# Patient Record
Sex: Male | Born: 1980 | Race: Black or African American | Hispanic: No | Marital: Married | Smoking: Current every day smoker
Health system: Southern US, Community
[De-identification: ages and names within clinical notes are randomized; demographics above are authoritative.]

## PROBLEM LIST (undated history)

## (undated) DIAGNOSIS — Z789 Other specified health status: Secondary | ICD-10-CM

## (undated) HISTORY — PX: PILONIDAL CYST EXCISION: SHX744

## (undated) HISTORY — PX: OTHER SURGICAL HISTORY: SHX169

## (undated) HISTORY — DX: Other specified health status: Z78.9

---

## 2000-07-01 ENCOUNTER — Emergency Department (HOSPITAL_COMMUNITY): Admission: EM | Admit: 2000-07-01 | Discharge: 2000-07-01 | Payer: Self-pay | Admitting: Emergency Medicine

## 2001-11-16 ENCOUNTER — Emergency Department (HOSPITAL_COMMUNITY): Admission: EM | Admit: 2001-11-16 | Discharge: 2001-11-16 | Payer: Self-pay | Admitting: Emergency Medicine

## 2004-08-17 ENCOUNTER — Emergency Department (HOSPITAL_COMMUNITY): Admission: EM | Admit: 2004-08-17 | Discharge: 2004-08-17 | Payer: Self-pay | Admitting: Emergency Medicine

## 2006-09-28 ENCOUNTER — Emergency Department (HOSPITAL_COMMUNITY): Admission: EM | Admit: 2006-09-28 | Discharge: 2006-09-28 | Payer: Self-pay | Admitting: Family Medicine

## 2008-02-15 ENCOUNTER — Emergency Department (HOSPITAL_COMMUNITY): Admission: EM | Admit: 2008-02-15 | Discharge: 2008-02-15 | Payer: Self-pay | Admitting: Emergency Medicine

## 2008-04-23 ENCOUNTER — Emergency Department (HOSPITAL_COMMUNITY): Admission: EM | Admit: 2008-04-23 | Discharge: 2008-04-23 | Payer: Self-pay | Admitting: Family Medicine

## 2008-12-31 ENCOUNTER — Emergency Department (HOSPITAL_COMMUNITY): Admission: EM | Admit: 2008-12-31 | Discharge: 2008-12-31 | Payer: Self-pay | Admitting: Emergency Medicine

## 2010-09-28 IMAGING — CR DG FOOT COMPLETE 3+V*R*
3 series · 3 of 3 positions shown · non-contrast
Comparison: None

CLINICAL DATA: Pain after jumping

RIGHT FOOT COMPLETE - 3+ VIEW

[view not recorded (1 of 3)]
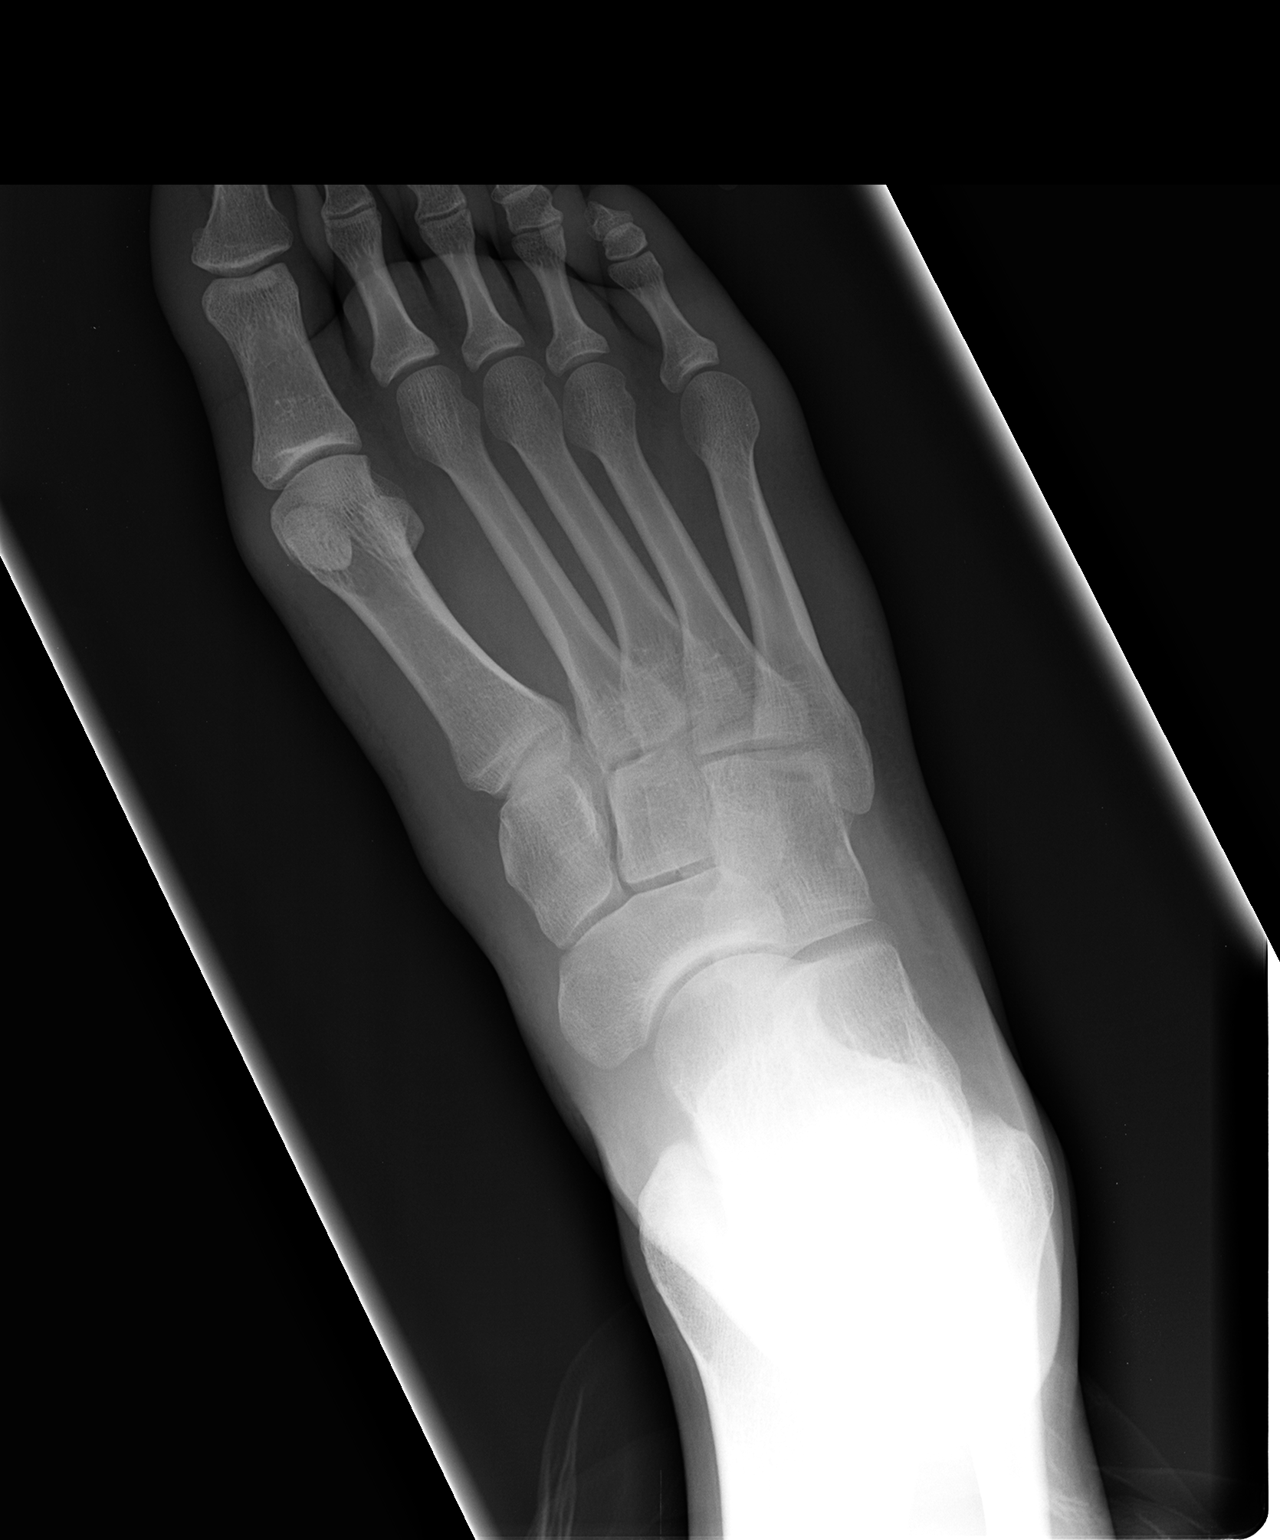

[view not recorded (2 of 3)]
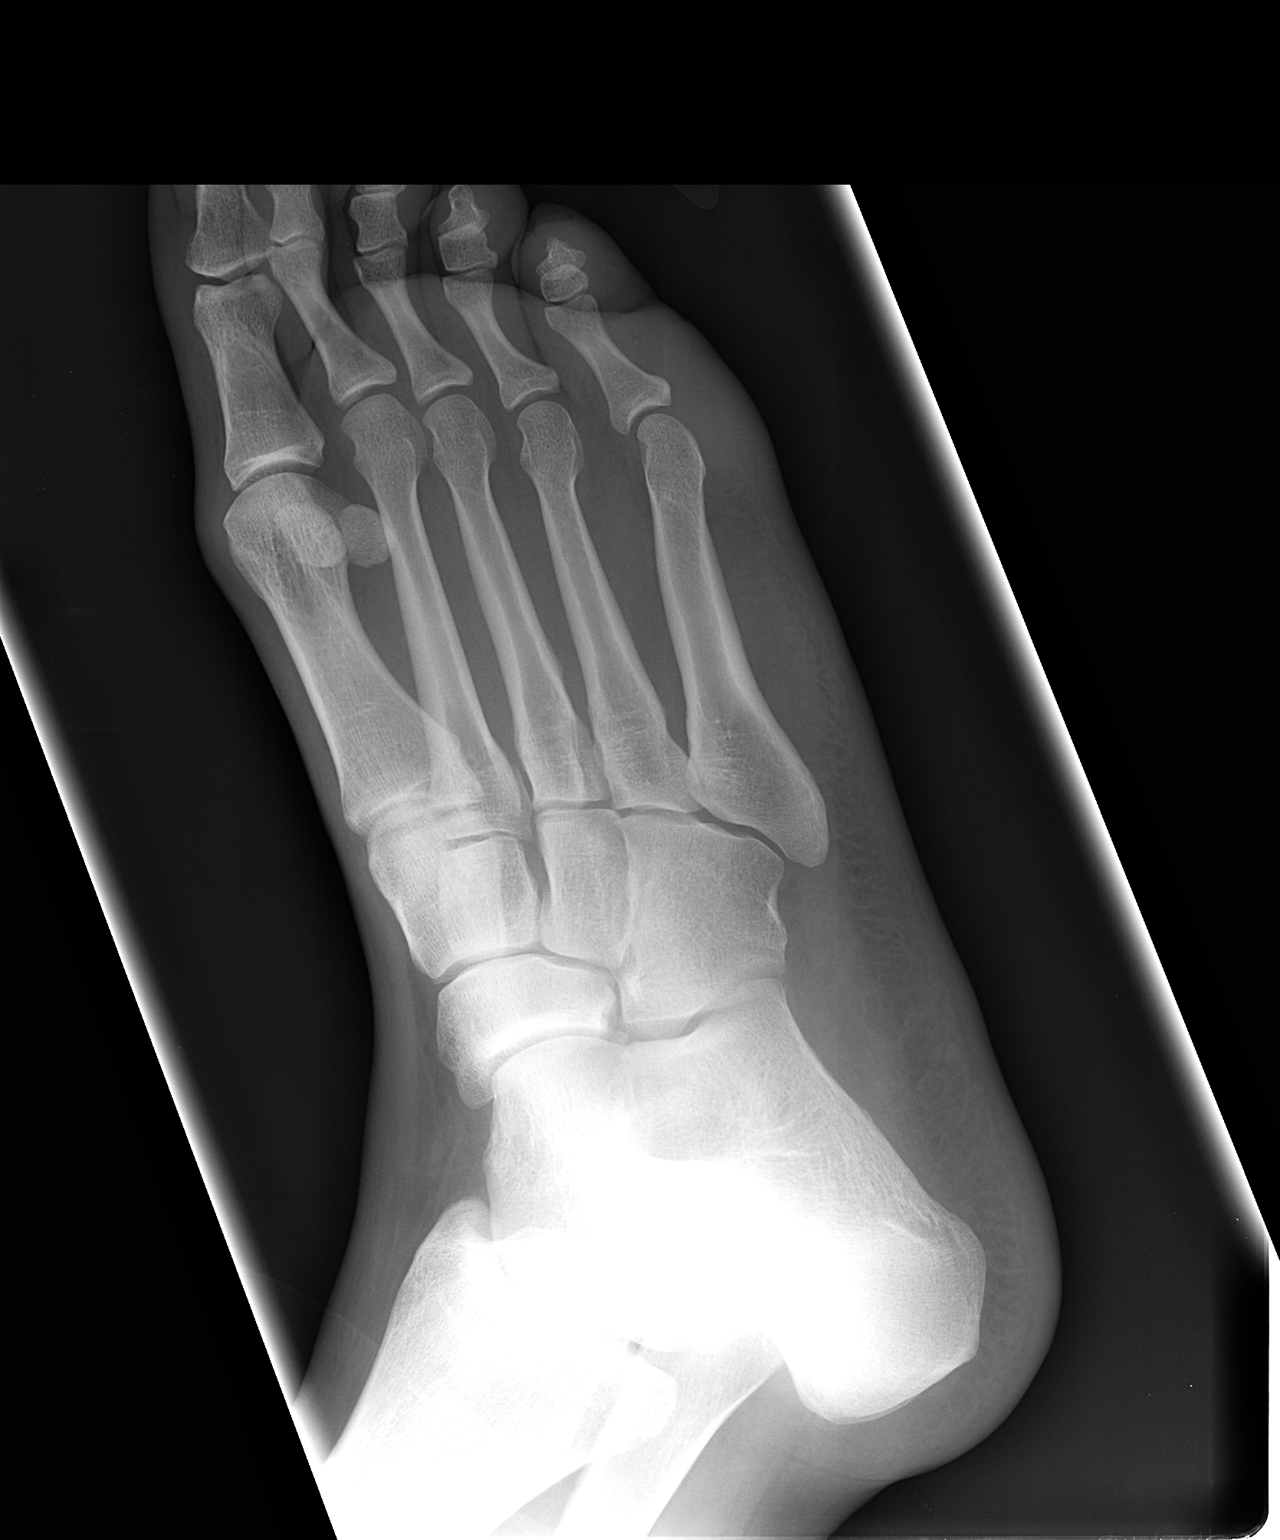

[view not recorded (3 of 3)]
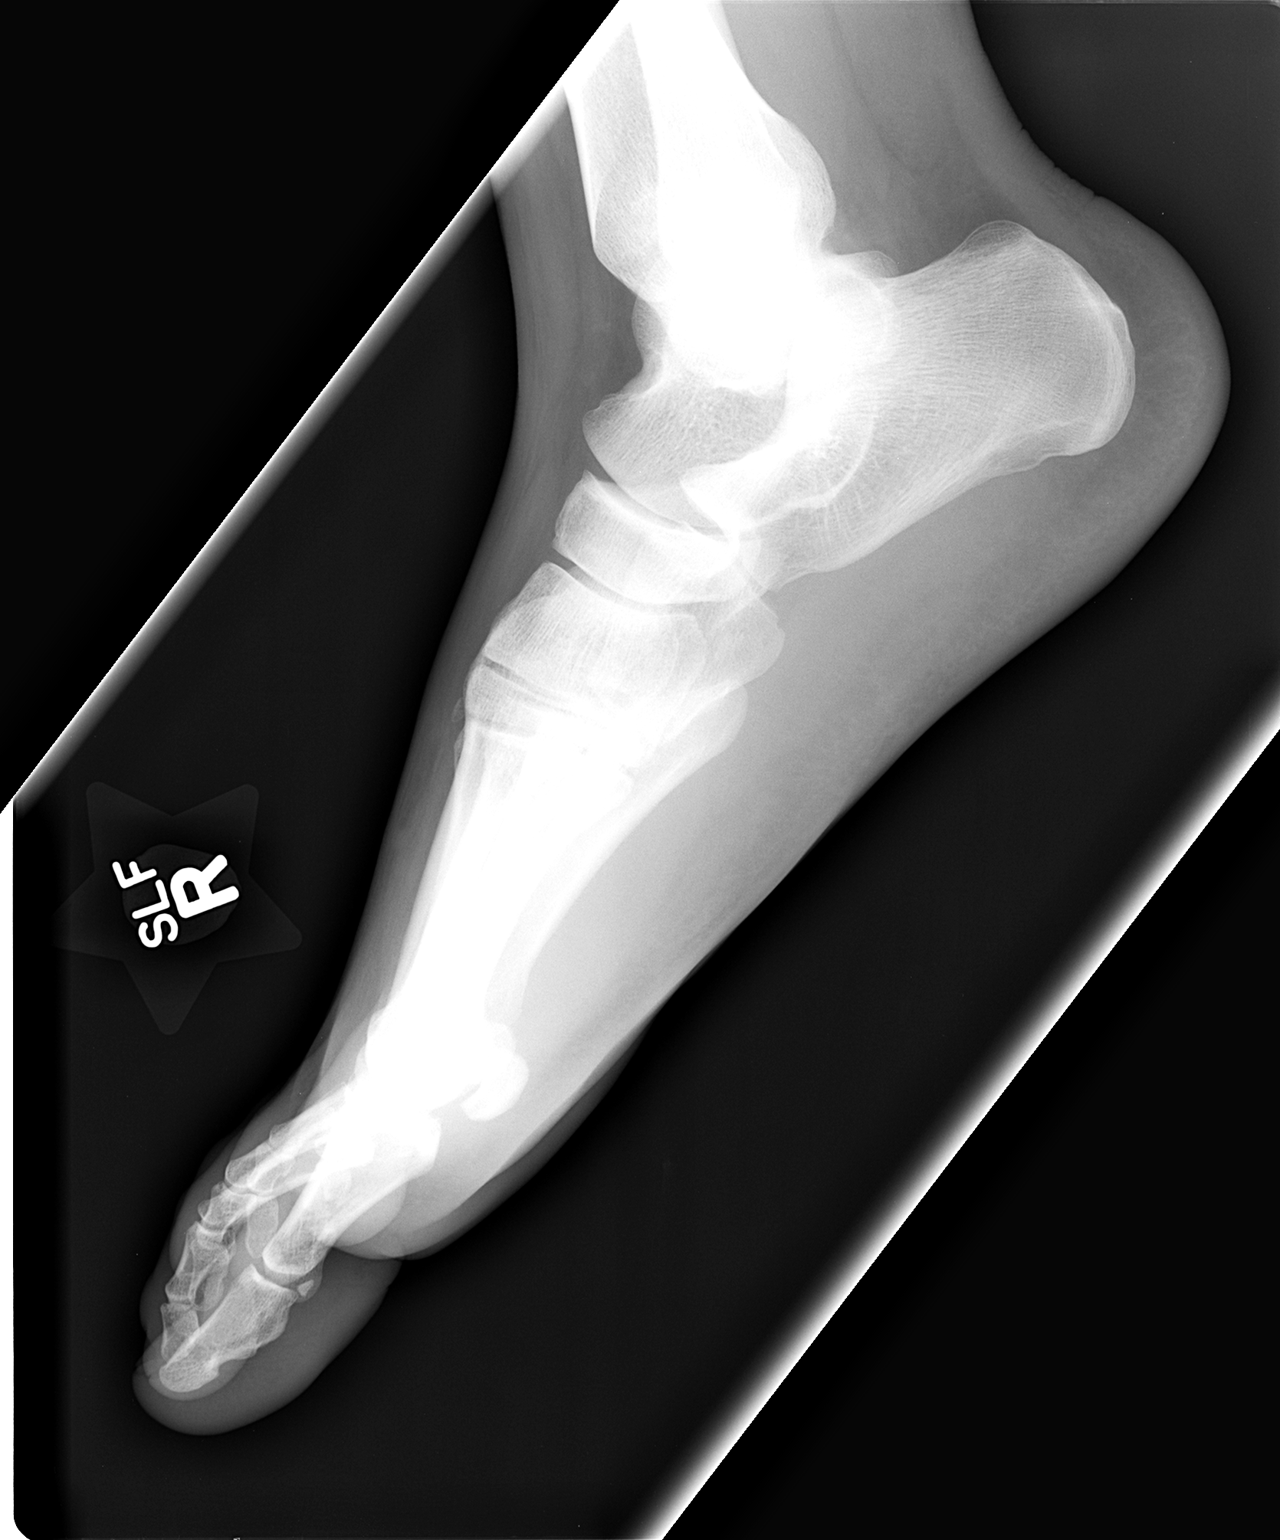

[3 of 3 positions shown; findings below may reference images not displayed]

FINDINGS: Negative for fracture, dislocation, or other acute
abnormality.  Normal alignment and mineralization. No significant
degenerative change.  Regional soft tissues unremarkable.
IMPRESSION: Negative

## 2011-04-10 ENCOUNTER — Inpatient Hospital Stay (INDEPENDENT_AMBULATORY_CARE_PROVIDER_SITE_OTHER)
Admission: RE | Admit: 2011-04-10 | Discharge: 2011-04-10 | Disposition: A | Discharge status: Home / Self Care | Payer: BC Managed Care – PPO | Source: Ambulatory Visit | Attending: Family Medicine | Admitting: Family Medicine

## 2011-04-10 DIAGNOSIS — M79609 Pain in unspecified limb: Secondary | ICD-10-CM

## 2011-04-10 LAB — GLUCOSE, CAPILLARY: Glucose-Capillary: 102 mg/dL — ABNORMAL HIGH (ref 70–99)

## 2011-07-21 LAB — CULTURE, ROUTINE-ABSCESS

## 2012-11-18 ENCOUNTER — Encounter (HOSPITAL_COMMUNITY): Payer: Self-pay | Admitting: Emergency Medicine

## 2012-11-18 ENCOUNTER — Emergency Department (HOSPITAL_COMMUNITY)
Admission: EM | Admit: 2012-11-18 | Discharge: 2012-11-18 | Disposition: A | Discharge status: Home / Self Care | Payer: BC Managed Care – PPO | Source: Home / Self Care | Attending: Emergency Medicine | Admitting: Emergency Medicine

## 2012-11-18 DIAGNOSIS — B353 Tinea pedis: Secondary | ICD-10-CM

## 2012-11-18 MED ORDER — DOMEBORO 25 % EX PACK: PACK | CUTANEOUS | Status: DC

## 2012-11-18 MED ORDER — NAFTIFINE HCL 2 % EX CREA: 1.0000 "application " | TOPICAL_CREAM | Freq: Two times a day (BID) | CUTANEOUS | Status: DC

## 2012-11-18 NOTE — ED Notes (Signed)
Pt c/o right foot irritation between toes. Area between toes are red and cracked. X 6 months.   Pt has used foot soaks. Bought new shoes and socks and rotates shoes that he wears to work.   Pt is having some pain.

## 2012-11-18 NOTE — ED Provider Notes (Signed)
Chief Complaint  Patient presents with  . Rash    rash between toes on right foot only and scaley dry patches on bottom of foot.     History of Present Illness:   Omar Shannon is a 32 year old male who presents with a six-month history of athlete's foot in the interdigital webs between the fourth, fifth, and third toes. This is very itchy. He's tried over-the-counter antifungals without relief as well as peroxide and alcohol. He denies any rash elsewhere on the body.  Review of Systems:  Other than noted above, the patient denies any of the following symptoms: Systemic:  No fever, chills, sweats, weight loss, or fatigue. ENT:  No nasal congestion, rhinorrhea, sore throat, swelling of lips, tongue or throat. Resp:  No cough, wheezing, or shortness of breath. Skin:  No rash, itching, nodules, or suspicious lesions.  PMFSH:  Past medical history, family history, social history, meds, and allergies were reviewed.  Physical Exam:   Vital signs:  BP 122/77  Pulse 67  Temp 97.7 F (36.5 C) (Oral)  Resp 18  SpO2 99% Gen:  Alert, oriented, in no distress. ENT:  Pharynx clear, no intraoral lesions, moist mucous membranes. Lungs:  Clear to auscultation. Skin:  There was cracking and scaling in the interdigital webs between the fourth and fifth and the third and fourth toes of the right foot. There is no rash on the plantar surface of the foot. The left foot was normal. Skin exam was otherwise unremarkable.  Assessment:  The encounter diagnosis was Tinea pedis.  Plan:   1.  The following meds were prescribed:   New Prescriptions   ALUM SULFATE-CA ACETATE (DOMEBORO) 25 % PACK    Dissolve 8 packets in 1 gallon of water.   NAFTIFINE HCL (NAFTIN) 2 % CREA    Apply 1 application topically 2 (two) times daily.   2.  The patient was instructed in symptomatic care and handouts were given. 3.  The patient was told to return if becoming worse in any way, if no better in 3 or 4 days, and given some  red flag symptoms that would indicate earlier return.     Reuben Likes, MD 11/18/12 [phone]

## 2012-11-26 MED ORDER — TERBINAFINE HCL 1 % EX CREA: TOPICAL_CREAM | Freq: Two times a day (BID) | CUTANEOUS | Status: DC

## 2018-08-27 ENCOUNTER — Encounter: Payer: Self-pay | Admitting: Family Medicine

## 2018-08-27 ENCOUNTER — Ambulatory Visit (INDEPENDENT_AMBULATORY_CARE_PROVIDER_SITE_OTHER): Payer: BLUE CROSS/BLUE SHIELD | Admitting: Family Medicine

## 2018-08-27 VITALS — BP 120/82 | HR 80 | Temp 98.2°F | Ht 67.0 in | Wt 249.1 lb

## 2018-08-27 DIAGNOSIS — Z6839 Body mass index (BMI) 39.0-39.9, adult: Secondary | ICD-10-CM | POA: Diagnosis not present

## 2018-08-27 DIAGNOSIS — E6609 Other obesity due to excess calories: Secondary | ICD-10-CM | POA: Insufficient documentation

## 2018-08-27 DIAGNOSIS — Z0001 Encounter for general adult medical examination with abnormal findings: Secondary | ICD-10-CM | POA: Diagnosis not present

## 2018-08-27 DIAGNOSIS — Z131 Encounter for screening for diabetes mellitus: Secondary | ICD-10-CM | POA: Insufficient documentation

## 2018-08-27 NOTE — Progress Notes (Addendum)
Subjective:  Patient ID: Omar Shannon, male    DOB: 1981-06-03  Age: 37 y.o. MRN: [id number]  CC: Establish Care   HPI Omar Shannon presents for to establish care and for physical exam.  To his knowledge he enjoys good health.  However he has gained some 30 pounds over the last few years.  He does smoke a half a pack of cigarettes a day.  He smokes marijuana on occasion.  He does not drink alcohol.  He lives with his wife.  He is currently working 2 jobs and Pharmacologist.  He does not have time to exercise.  His mom has hypertension.  Father's health history is mostly unknown.  He has had noticed some visual changes involving OD.  He has never had a formal eye exam.  He is nonfasting today.  He tells me that his well pump was recently changed and the bill was $1800. History Dshawn has no past medical history on file.   He has a past surgical history that includes inguinal cyst.   His family history includes Hypertension in his mother.He reports that he has been smoking. He has been smoking about 0.50 packs per day. He has never used smokeless tobacco. He reports that . Drug: Marijuana. He reports that he does not drink alcohol.  Outpatient Medications Prior to Visit  Medication Sig Dispense Refill  . Alum Sulfate-Ca Acetate (DOMEBORO) 25 % PACK Dissolve 8 packets in 1 gallon of water. 8 each 3  . Naftifine HCl (NAFTIN) 2 % CREA Apply 1 application topically 2 (two) times daily. 45 g 3  . terbinafine (LAMISIL) 1 % cream Apply topically 2 (two) times daily. 30 g 2   No facility-administered medications prior to visit.     ROS Review of Systems  Constitutional: Negative for chills, diaphoresis, fatigue, fever and unexpected weight change.  HENT: Negative.   Eyes: Positive for visual disturbance. Negative for photophobia, pain and redness.  Respiratory: Negative.   Cardiovascular: Negative.   Gastrointestinal: Negative.   Endocrine: Negative for polyphagia and polyuria.    Genitourinary: Negative.   Musculoskeletal: Negative for gait problem and joint swelling.  Skin: Negative for pallor and rash.  Neurological: Negative for weakness and headaches.  Hematological: Does not bruise/bleed easily.  Psychiatric/Behavioral: Negative.     Objective:  BP 120/82 (BP Location: Right Arm, Patient Position: Sitting, Cuff Size: Large)   Pulse 80   Temp 98.2 F (36.8 C) (Oral)   Ht 5\' 7"  (1.702 m)   Wt 249 lb 2 oz (113 kg)   SpO2 98%   BMI 39.02 kg/m   Physical Exam  Constitutional: He is oriented to person, place, and time. He appears well-developed and well-nourished. No distress.  HENT:  Head: Normocephalic and atraumatic.  Right Ear: External ear normal.  Left Ear: External ear normal.  Nose: Nose normal.  Mouth/Throat: Oropharynx is clear and moist. No oropharyngeal exudate.  Eyes: Pupils are equal, round, and reactive to light. Conjunctivae and EOM are normal. Right eye exhibits no discharge. Left eye exhibits no discharge. No scleral icterus.  Neck: Neck supple. No JVD present. No tracheal deviation present. No thyromegaly present.  Cardiovascular: Normal rate, regular rhythm and normal heart sounds.  Pulmonary/Chest: Effort normal and breath sounds normal.  Abdominal: Soft. Bowel sounds are normal. He exhibits distension. He exhibits no mass. There is no tenderness. There is no rebound and no guarding. Hernia confirmed negative in the right inguinal area and confirmed negative in the left  inguinal area.  Genitourinary: Testes normal and penis normal. Right testis shows no mass, no swelling and no tenderness. Right testis is descended. Left testis shows no mass, no swelling and no tenderness. Left testis is descended. Circumcised. No hypospadias, penile erythema or penile tenderness. No discharge found.  Musculoskeletal: He exhibits no edema.  Lymphadenopathy:    He has no cervical adenopathy. No inguinal adenopathy noted on the right or left side.   Neurological: He is alert and oriented to person, place, and time.  Skin: Skin is warm and dry. He is not diaphoretic.  Psychiatric: He has a normal mood and affect. His behavior is normal.      Assessment & Plan:   Mills was seen today for establish care.  Diagnoses and all orders for this visit:  Encounter for health maintenance examination with abnormal findings -     CBC; Future -     Comprehensive metabolic panel; Future -     Lipid panel; Future -     Urinalysis, Routine w reflex microscopic; Future -     Ambulatory referral to Ophthalmology  Class 2 obesity due to excess calories with body mass index (BMI) of 39.0 to 39.9 in adult, unspecified whether serious comorbidity present   I have discontinued Audric D. Montini's Naftifine HCl, DOMEBORO, and terbinafine.  No orders of the defined types were placed in this encounter.  Patient was given information on health maintenance and disease prevention including exercising to lose weight and information on obesity.  Suggested that he start an exercise routine shooting for 30 minutes of physical lacks exercise at least 5 days a week.  He was advised to stop smoking.  We talked about looking into trach programs offered at [employer] in order to find a higher pain job that would afford him more time at home and avoid the stress working 2 jobs.  Suggested follow-up will pend the results of his lab work.  Patient will return fasting for above ordered blood work.  Follow-up: Return in about 6 months (around 02/25/2019), or suggested follow up pends lab results.  Mliss Sax, MD

## 2018-08-27 NOTE — Patient Instructions (Signed)
Exercising to Lose Weight Exercising can help you to lose weight. In order to lose weight through exercise, you need to do vigorous-intensity exercise. You can tell that you are exercising with vigorous intensity if you are breathing very hard and fast and cannot hold a conversation while exercising. Moderate-intensity exercise helps to maintain your current weight. You can tell that you are exercising at a moderate level if you have a higher heart rate and faster breathing, but you are still able to hold a conversation. How often should I exercise? Choose an activity that you enjoy and set realistic goals. Your health care provider can help you to make an activity plan that works for you. Exercise regularly as directed by your health care provider. This may include:  Doing resistance training twice each week, such as: ? Push-ups. ? Sit-ups. ? Lifting weights. ? Using resistance bands.  Doing a given intensity of exercise for a given amount of time. Choose from these options: ? 150 minutes of moderate-intensity exercise every week. ? 75 minutes of vigorous-intensity exercise every week. ? A mix of moderate-intensity and vigorous-intensity exercise every week.  Children, pregnant women, people who are out of shape, people who are overweight, and older adults may need to consult a health care provider for individual recommendations. If you have any sort of medical condition, be sure to consult your health care provider before starting a new exercise program. What are some activities that can help me to lose weight?  Walking at a rate of at least 4.5 miles an hour.  Jogging or running at a rate of 5 miles per hour.  Biking at a rate of at least 10 miles per hour.  Lap swimming.  Roller-skating or in-line skating.  Cross-country skiing.  Vigorous competitive sports, such as football, basketball, and soccer.  Jumping rope.  Aerobic dancing. How can I be more active in my day-to-day  activities?  Use the stairs instead of the elevator.  Take a walk during your lunch break.  If you drive, park your car farther away from work or school.  If you take public transportation, get off one stop early and walk the rest of the way.  Make all of your phone calls while standing up and walking around.  Get up, stretch, and walk around every 30 minutes throughout the day. What guidelines should I follow while exercising?  Do not exercise so much that you hurt yourself, feel dizzy, or get very short of breath.  Consult your health care provider prior to starting a new exercise program.  Wear comfortable clothes and shoes with good support.  Drink plenty of water while you exercise to prevent dehydration or heat stroke. Body water is lost during exercise and must be replaced.  Work out until you breathe faster and your heart beats faster. This information is not intended to replace advice given to you by your health care provider. Make sure you discuss any questions you have with your health care provider. Document Released: 11/12/2010 Document Revised: 03/17/2016 Document Reviewed: 03/13/2014 Elsevier Interactive Patient Education  2018 [employer] Inc.  Health Maintenance, Male A healthy lifestyle and preventive care is important for your health and wellness. Ask your health care provider about what schedule of regular examinations is right for you. What should I know about weight and diet? Eat a Healthy Diet  Eat plenty of vegetables, fruits, whole grains, low-fat dairy products, and lean protein.  Do not eat a lot of foods high in solid fats,   added sugars, or salt.  Maintain a Healthy Weight Regular exercise can help you achieve or maintain a healthy weight. You should:  Do at least 150 minutes of exercise each week. The exercise should increase your heart rate and make you sweat (moderate-intensity exercise).  Do strength-training exercises at least twice a  week.  Watch Your Levels of Cholesterol and Blood Lipids  Have your blood tested for lipids and cholesterol every 5 years starting at 37 years of age. If you are at high risk for heart disease, you should start having your blood tested when you are 37 years old. You may need to have your cholesterol levels checked more often if: ? Your lipid or cholesterol levels are high. ? You are older than 37 years of age. ? You are at high risk for heart disease.  What should I know about cancer screening? Many types of cancers can be detected early and may often be prevented. Lung Cancer  You should be screened every year for lung cancer if: ? You are a current smoker who has smoked for at least 30 years. ? You are a former smoker who has quit within the past 15 years.  Talk to your health care provider about your screening options, when you should start screening, and how often you should be screened.  Colorectal Cancer  Routine colorectal cancer screening usually begins at 37 years of age and should be repeated every 5-10 years until you are 37 years old. You may need to be screened more often if early forms of precancerous polyps or small growths are found. Your health care provider may recommend screening at an earlier age if you have risk factors for colon cancer.  Your health care provider may recommend using home test kits to check for hidden blood in the stool.  A small camera at the end of a tube can be used to examine your colon (sigmoidoscopy or colonoscopy). This checks for the earliest forms of colorectal cancer.  Prostate and Testicular Cancer  Depending on your age and overall health, your health care provider may do certain tests to screen for prostate and testicular cancer.  Talk to your health care provider about any symptoms or concerns you have about testicular or prostate cancer.  Skin Cancer  Check your skin from head to toe regularly.  Tell your health care provider  about any new moles or changes in moles, especially if: ? There is a change in a mole's size, shape, or color. ? You have a mole that is larger than a pencil eraser.  Always use sunscreen. Apply sunscreen liberally and repeat throughout the day.  Protect yourself by wearing long sleeves, pants, a wide-brimmed hat, and sunglasses when outside.  What should I know about heart disease, diabetes, and high blood pressure?  If you are 18-39 years of age, have your blood pressure checked every 3-5 years. If you are 40 years of age or older, have your blood pressure checked every year. You should have your blood pressure measured twice-once when you are at a hospital or clinic, and once when you are not at a hospital or clinic. Record the average of the two measurements. To check your blood pressure when you are not at a hospital or clinic, you can use: ? An automated blood pressure machine at a pharmacy. ? A home blood pressure monitor.  Talk to your health care provider about your target blood pressure.  If you are between 45-79 years old, ask   your health care provider if you should take aspirin to prevent heart disease.  Have regular diabetes screenings by checking your fasting blood sugar level. ? If you are at a normal weight and have a low risk for diabetes, have this test once every three years after the age of 45. ? If you are overweight and have a high risk for diabetes, consider being tested at a younger age or more often.  A one-time screening for abdominal aortic aneurysm (AAA) by ultrasound is recommended for men aged 65-75 years who are current or former smokers. What should I know about preventing infection? Hepatitis B If you have a higher risk for hepatitis B, you should be screened for this virus. Talk with your health care provider to find out if you are at risk for hepatitis B infection. Hepatitis C Blood testing is recommended for:  Everyone born from 1945 through  1965.  Anyone with known risk factors for hepatitis C.  Sexually Transmitted Diseases (STDs)  You should be screened each year for STDs including gonorrhea and chlamydia if: ? You are sexually active and are younger than 37 years of age. ? You are older than 37 years of age and your health care provider tells you that you are at risk for this type of infection. ? Your sexual activity has changed since you were last screened and you are at an increased risk for chlamydia or gonorrhea. Ask your health care provider if you are at risk.  Talk with your health care provider about whether you are at high risk of being infected with HIV. Your health care provider may recommend a prescription medicine to help prevent HIV infection.  What else can I do?  Schedule regular health, dental, and eye exams.  Stay current with your vaccines (immunizations).  Do not use any tobacco products, such as cigarettes, chewing tobacco, and e-cigarettes. If you need help quitting, ask your health care provider.  Limit alcohol intake to no more than 2 drinks per day. One drink equals 12 ounces of beer, 5 ounces of wine, or 1 ounces of hard liquor.  Do not use street drugs.  Do not share needles.  Ask your health care provider for help if you need support or information about quitting drugs.  Tell your health care provider if you often feel depressed.  Tell your health care provider if you have ever been abused or do not feel safe at home. This information is not intended to replace advice given to you by your health care provider. Make sure you discuss any questions you have with your health care provider. Document Released: 04/07/2008 Document Revised: 06/08/2016 Document Reviewed: 07/14/2015 Elsevier Interactive Patient Education  2018 Else[employer] Inc.  Obesity, Adult Obesity is the condition of having too much total body fat. Being overweight or obese means that your weight is greater than what is  considered healthy for your body size. Obesity is determined by a measurement called BMI. BMI is an estimate of body fat and is calculated from height and weight. For adults, a BMI of 30 or higher is considered obese. Obesity can eventually lead to other health concerns and major illnesses, including:  Stroke.  Coronary artery disease (CAD).  Type 2 diabetes.  Some types of cancer, including cancers of the colon, breast, uterus, and gallbladder.  Osteoarthritis.  High blood pressure (hypertension).  High cholesterol.  Sleep apnea.  Gallbladder stones.  Infertility problems.  What are the causes? The main cause of obesity is   taking in (consuming) more calories than your body uses for energy. Other factors that contribute to this condition may include:  Being born with genes that make you more likely to become obese.  Having a medical condition that causes obesity. These conditions include: ? Hypothyroidism. ? Polycystic ovarian syndrome (PCOS). ? Binge-eating disorder. ? Cushing syndrome.  Taking certain medicines, such as steroids, antidepressants, and seizure medicines.  Not being physically active (sedentary lifestyle).  Living where there are limited places to exercise safely or buy healthy foods.  Not getting enough sleep.  What increases the risk? The following factors may increase your risk of this condition:  Having a family history of obesity.  Being a woman of African-American descent.  Being a man of Hispanic descent.  What are the signs or symptoms? Having excessive body fat is the main symptom of this condition. How is this diagnosed? This condition may be diagnosed based on:  Your symptoms.  Your medical history.  A physical exam. Your health care provider may measure: ? Your BMI. If you are an adult with a BMI between 25 and less than 30, you are considered overweight. If you are an adult with a BMI of 30 or higher, you are considered  obese. ? The distances around your hips and your waist (circumferences). These may be compared to each other to help diagnose your condition. ? Your skinfold thickness. Your health care provider may gently pinch a fold of your skin and measure it.  How is this treated? Treatment for this condition often includes changing your lifestyle. Treatment may include some or all of the following:  Dietary changes. Work with your health care provider and a dietitian to set a weight-loss goal that is healthy and reasonable for you. Dietary changes may include eating: ? Smaller portions. A portion size is the amount of a particular food that is healthy for you to eat at one time. This varies from person to person. ? Low-calorie or low-fat options. ? More whole grains, fruits, and vegetables.  Regular physical activity. This may include aerobic activity (cardio) and strength training.  Medicine to help you lose weight. Your health care provider may prescribe medicine if you are unable to lose 1 pound a week after 6 weeks of eating more healthily and doing more physical activity.  Surgery. Surgical options may include gastric banding and gastric bypass. Surgery may be done if: ? Other treatments have not helped to improve your condition. ? You have a BMI of 40 or higher. ? You have life-threatening health problems related to obesity.  Follow these instructions at home:  Eating and drinking   Follow recommendations from your health care provider about what you eat and drink. Your health care provider may advise you to: ? Limit fast foods, sweets, and processed snack foods. ? Choose low-fat options, such as low-fat milk instead of whole milk. ? Eat 5 or more servings of fruits or vegetables every day. ? Eat at home more often. This gives you more control over what you eat. ? Choose healthy foods when you eat out. ? Learn what a healthy portion size is. ? Keep low-fat snacks on hand. ? Avoid sugary  drinks, such as soda, fruit juice, iced tea sweetened with sugar, and flavored milk. ? Eat a healthy breakfast.  Drink enough water to keep your urine clear or pale yellow.  Do not go without eating for long periods of time (do not fast) or follow a fad diet. Fasting and   fad diets can be unhealthy and even dangerous. Physical Activity  Exercise regularly, as told by your health care provider. Ask your health care provider what types of exercise are safe for you and how often you should exercise.  Warm up and stretch before being active.  Cool down and stretch after being active.  Rest between periods of activity. Lifestyle  Limit the time that you spend in front of your TV, computer, or video game system.  Find ways to reward yourself that do not involve food.  Limit alcohol intake to no more than 1 drink a day for nonpregnant women and 2 drinks a day for men. One drink equals 12 oz of beer, 5 oz of wine, or 1 oz of hard liquor. General instructions  Keep a weight loss journal to keep track of the food you eat and how much you exercise you get.  Take over-the-counter and prescription medicines only as told by your health care provider.  Take vitamins and supplements only as told by your health care provider.  Consider joining a support group. Your health care provider may be able to recommend a support group.  Keep all follow-up visits as told by your health care provider. This is important. Contact a health care provider if:  You are unable to meet your weight loss goal after 6 weeks of dietary and lifestyle changes. This information is not intended to replace advice given to you by your health care provider. Make sure you discuss any questions you have with your health care provider. Document Released: 11/17/2004 Document Revised: 03/14/2016 Document Reviewed: 07/29/2015 Elsevier Interactive Patient Education  2018 [employer] Inc.  

## 2018-08-28 ENCOUNTER — Encounter: Payer: Self-pay | Admitting: Family Medicine

## 2018-08-29 ENCOUNTER — Other Ambulatory Visit (INDEPENDENT_AMBULATORY_CARE_PROVIDER_SITE_OTHER): Payer: BLUE CROSS/BLUE SHIELD

## 2018-08-29 DIAGNOSIS — Z0001 Encounter for general adult medical examination with abnormal findings: Secondary | ICD-10-CM

## 2018-08-29 LAB — LIPID PANEL
CHOL/HDL RATIO: 6
CHOLESTEROL: 214 mg/dL — AB (ref 0–200)
HDL: 33.7 mg/dL — ABNORMAL LOW (ref >39.00)
NonHDL: 180.43
Triglycerides: 218 mg/dL — ABNORMAL HIGH (ref 0.0–149.0)
VLDL: 43.6 mg/dL — ABNORMAL HIGH (ref 0.0–40.0)

## 2018-08-29 LAB — CBC
HCT: 44.7 % (ref 39.0–52.0)
Hemoglobin: 14.8 g/dL (ref 13.0–17.0)
MCHC: 33.1 g/dL (ref 30.0–36.0)
MCV: 90 fl (ref 78.0–100.0)
PLATELETS: 313 10*3/uL (ref 150.0–400.0)
RBC: 4.97 Mil/uL (ref 4.22–5.81)
RDW: 13.6 % (ref 11.5–15.5)
WBC: 12.9 10*3/uL — AB (ref 4.0–10.5)

## 2018-08-29 LAB — URINALYSIS, ROUTINE W REFLEX MICROSCOPIC
Bilirubin Urine: NEGATIVE
Ketones, ur: NEGATIVE
LEUKOCYTES UA: NEGATIVE
NITRITE: NEGATIVE
Specific Gravity, Urine: >=1.03 — AB (ref 1.000–1.030)
TOTAL PROTEIN, URINE-UPE24: NEGATIVE
URINE GLUCOSE: NEGATIVE
Urobilinogen, UA: 0.2 (ref 0.0–1.0)
pH: 5.5 (ref 5.0–8.0)

## 2018-08-29 LAB — COMPREHENSIVE METABOLIC PANEL
ALBUMIN: 4.4 g/dL (ref 3.5–5.2)
ALK PHOS: 73 U/L (ref 39–117)
ALT: 18 U/L (ref 0–53)
AST: 14 U/L (ref 0–37)
BILIRUBIN TOTAL: 0.5 mg/dL (ref 0.2–1.2)
BUN: 19 mg/dL (ref 6–23)
CALCIUM: 9.8 mg/dL (ref 8.4–10.5)
CHLORIDE: 105 meq/L (ref 96–112)
CO2: 26 meq/L (ref 19–32)
Creatinine, Ser: 1.17 mg/dL (ref 0.40–1.50)
GFR: 89.93 mL/min (ref >60.00)
Glucose, Bld: 94 mg/dL (ref 70–99)
Potassium: 4.4 mEq/L (ref 3.5–5.1)
SODIUM: 139 meq/L (ref 135–145)
Total Protein: 6.9 g/dL (ref 6.0–8.3)

## 2018-08-29 LAB — LDL CHOLESTEROL, DIRECT: Direct LDL: 120 mg/dL

## 2018-08-31 ENCOUNTER — Encounter: Payer: Self-pay | Admitting: Family Medicine

## 2019-02-14 ENCOUNTER — Telehealth: Payer: Self-pay | Admitting: Family Medicine

## 2019-02-14 NOTE — Telephone Encounter (Signed)
Called pt on behalf of Dr Doreene Burke to try and set up his 6 month follow up with virtual visit, Patient said he doesn't have insurance right now so he said he will wait for now

## 2021-07-05 ENCOUNTER — Ambulatory Visit: Payer: BLUE CROSS/BLUE SHIELD | Admitting: Family Medicine

## 2021-09-29 ENCOUNTER — Ambulatory Visit: Payer: Managed Care, Other (non HMO) | Admitting: Nurse Practitioner

## 2021-09-29 ENCOUNTER — Encounter: Payer: Self-pay | Admitting: Nurse Practitioner

## 2021-09-29 ENCOUNTER — Other Ambulatory Visit: Payer: Self-pay

## 2021-09-29 VITALS — BP 122/78 | HR 80 | Temp 97.0°F | Ht 67.0 in | Wt 249.4 lb

## 2021-09-29 DIAGNOSIS — R0683 Snoring: Secondary | ICD-10-CM | POA: Insufficient documentation

## 2021-09-29 DIAGNOSIS — Z13220 Encounter for screening for lipoid disorders: Secondary | ICD-10-CM | POA: Diagnosis not present

## 2021-09-29 DIAGNOSIS — R0681 Apnea, not elsewhere classified: Secondary | ICD-10-CM | POA: Diagnosis not present

## 2021-09-29 DIAGNOSIS — Z23 Encounter for immunization: Secondary | ICD-10-CM | POA: Diagnosis not present

## 2021-09-29 DIAGNOSIS — Z136 Encounter for screening for cardiovascular disorders: Secondary | ICD-10-CM

## 2021-09-29 DIAGNOSIS — Z0001 Encounter for general adult medical examination with abnormal findings: Secondary | ICD-10-CM

## 2021-09-29 DIAGNOSIS — Z800 Family history of malignant neoplasm of digestive organs: Secondary | ICD-10-CM | POA: Diagnosis not present

## 2021-09-29 DIAGNOSIS — Z1211 Encounter for screening for malignant neoplasm of colon: Secondary | ICD-10-CM | POA: Diagnosis not present

## 2021-09-29 NOTE — Assessment & Plan Note (Addendum)
Witnessed by wife and coworkers. Associated with choking episodes. Admits to excessive ETOH consumption (1bottle of brown liquor per week) and daily tobacco use. BP Readings from Last 3 Encounters:  09/29/21 122/78  08/27/18 120/82  11/18/12 122/77   Wt Readings from Last 3 Encounters:  09/29/21 249 lb 6.4 oz (113.1 kg)  08/27/18 249 lb 2 oz (113 kg)   He agreed to referral to [facility]. Order entered Advised about the correlation between obesity, ETOH use and apneic symptoms. Advised about his risk for CAD and CVD. Advised about the importance of weight loss through diet and exercise, and about the recommended ETOH serving for an adult male.

## 2021-09-29 NOTE — Progress Notes (Signed)
Subjective:    Patient ID: Omar Shannon, male    DOB: 18-Aug-1981, 40 y.o.   MRN: [id number]  Patient presents today for CPE and eval of chronic conditions  HPI Witnessed apneic spells Witnessed by wife and coworkers. Associated with choking episodes. Admits to excessive ETOH consumption (1bottle of brown liquor per week) and daily tobacco use. BP Readings from Last 3 Encounters:  09/29/21 122/78  08/27/18 120/82  11/18/12 122/77   Wt Readings from Last 3 Encounters:  09/29/21 249 lb 6.4 oz (113.1 kg)  08/27/18 249 lb 2 oz (113 kg)   He agreed to referral to [facility]. Order entered Advised about the correlation between obesity, ETOH use and apneic symptoms. Advised about his risk for CAD and CVD. Advised about the importance of weight loss through diet and exercise, and about the recommended ETOH serving for an adult male.  Vision:will schedule Dental:will schedule Diet:regular Exercise:none Weight:  Wt Readings from Last 3 Encounters:  09/29/21 249 lb 6.4 oz (113.1 kg)  08/27/18 249 lb 2 oz (113 kg)    Sexual History (orientation,birth control, marital status, STD):no need for STD screen, no change in GI/GU symptoms, Fhx of colon cancer so he is requesting referral to GI  Depression/Suicide: Depression screen [facility] 2/9 09/29/2021 09/29/2021  Decreased Interest 2 2  Down, Depressed, Hopeless 2 2  PHQ - 2 Score 4 4  Altered sleeping 0 -  Tired, decreased energy 2 -  Change in appetite 3 -  Feeling bad or failure about yourself  2 -  Trouble concentrating 0 -  Moving slowly or fidgety/restless 0 -  Suicidal thoughts 0 -  PHQ-9 Score 11 -  Difficult doing work/chores Very difficult -   GAD 7 : Generalized Anxiety Score 09/29/2021  Nervous, Anxious, on Edge 2  Control/stop worrying 1  Worry too much - different things 0  Trouble relaxing 1  Restless 1  Easily annoyed or irritable 3  Afraid - awful might happen 2  Total GAD 7 Score 10  Anxiety Difficulty Very  difficult   Immunizations: (TDAP, Hep C screen, Pneumovax, Influenza, zoster)  Health Maintenance  Topic Date Due   COVID-19 Vaccine (1) Never done   Pneumococcal Vaccination (1 - PCV) Never done   HIV Screening  Never done   Hepatitis C Screening: USPSTF Recommendation to screen - Ages 69-79 yo.  Never done   Flu Shot  01/21/2022*   Tetanus Vaccine  09/30/2031   HPV Vaccine  Aged Out  *Topic was postponed. The date shown is not the original due date.   Fall Risk: Fall Risk  09/29/2021  Falls in the past year? 0  Number falls in past yr: 0  Injury with Fall? 0  Risk for fall due to : No Fall Risks   Medications and allergies reviewed with patient and updated if appropriate.  Patient Active Problem List   Diagnosis Date Noted   Loud snoring 09/29/2021   Witnessed apneic spells 09/29/2021   Family history of colon cancer 09/29/2021   Encounter for health maintenance examination with abnormal findings 08/27/2018   Class 2 obesity due to excess calories with body mass index (BMI) of 39.0 to 39.9 in adult 08/27/2018    No current outpatient medications on file prior to visit.   No current facility-administered medications on file prior to visit.    History reviewed. No pertinent past medical history.  Past Surgical History:  Procedure Laterality Date   inguinal cyst  Social History   Socioeconomic History   Marital status: Married    Spouse name: Not on file   Number of children: 2   Years of education: Not on file   Highest education level: Not on file  Occupational History   Not on file  Tobacco Use   Smoking status: Every Day    Types: Cigars   Smokeless tobacco: Never  Vaping Use   Vaping Use: Never used  Substance and Sexual Activity   Alcohol use: Yes    Alcohol/week: 15.0 standard drinks    Types: 15 Shots of liquor per week   Drug use: Not Currently    Types: Marijuana    Comment: quit 6weeks ago   Sexual activity: Yes    Birth  control/protection: Condom  Other Topics Concern   Not on file  Social History Narrative   Not on file   Social Determinants of Health   Financial Resource Strain: Not on file  Food Insecurity: Not on file  Transportation Needs: Not on file  Physical Activity: Not on file  Stress: Not on file  Social Connections: Not on file   Family History  Problem Relation Age of Onset   Hypertension Mother    Cancer Father 40       kidney cancer   Cancer Paternal Grandmother        colon cancer       Review of Systems  Constitutional:  Negative for fever, malaise/fatigue and weight loss.  HENT:  Negative for congestion and sore throat.   Eyes:        Negative for visual changes  Respiratory:  Negative for cough and shortness of breath.   Cardiovascular:  Negative for chest pain, palpitations and leg swelling.  Gastrointestinal:  Negative for blood in stool, constipation, diarrhea and heartburn.  Genitourinary:  Negative for dysuria, frequency and urgency.  Musculoskeletal:  Negative for falls, joint pain and myalgias.  Skin:  Negative for rash.  Neurological:  Negative for dizziness, sensory change and headaches.  Endo/Heme/Allergies:  Does not bruise/bleed easily.  Psychiatric/Behavioral:  Positive for depression. Negative for hallucinations, memory loss, substance abuse and suicidal ideas. The patient is nervous/anxious. The patient does not have insomnia.    Objective:   Vitals:   09/29/21 1300  BP: 122/78  Pulse: 80  Temp: (!) 97 F (36.1 C)  SpO2: 97%    Body mass index is 39.06 kg/m.   Physical Examination:  Physical Exam Vitals reviewed.  Constitutional:      General: He is not in acute distress.    Appearance: He is well-developed. He is obese.  HENT:     Right Ear: Tympanic membrane, ear canal and external ear normal.     Left Ear: Tympanic membrane, ear canal and external ear normal.  Eyes:     Extraocular Movements: Extraocular movements intact.      Conjunctiva/sclera: Conjunctivae normal.  Cardiovascular:     Rate and Rhythm: Normal rate and regular rhythm.     Heart sounds: Normal heart sounds.  Pulmonary:     Effort: Pulmonary effort is normal. No respiratory distress.     Breath sounds: Normal breath sounds.  Chest:     Chest wall: No tenderness.  Abdominal:     General: Bowel sounds are normal.     Palpations: Abdomen is soft.  Musculoskeletal:        General: Normal range of motion.     Cervical back: Normal range of motion and neck  supple.     Right lower leg: No edema.     Left lower leg: No edema.  Skin:    General: Skin is warm and dry.  Neurological:     Mental Status: He is alert and oriented to person, place, and time.     Deep Tendon Reflexes: Reflexes are normal and symmetric.  Psychiatric:        Behavior: Behavior normal.        Thought Content: Thought content normal.   ASSESSMENT and PLAN: This visit occurred during the SARS-CoV-2 public health emergency.  Safety protocols were in place, including screening questions prior to the visit, additional usage of staff PPE, and extensive cleaning of exam room while observing appropriate contact time as indicated for disinfecting solutions.   Omar Shannon was seen today for establish care.  Diagnoses and all orders for this visit:  Encounter for preventative adult health care exam with abnormal findings -     Cancel: Comprehensive metabolic panel -     Cancel: CBC with Differential/Platelet -     Cancel: Lipid panel -     Cancel: TSH -     Ambulatory referral to Gastroenterology -     CBC with Differential/Platelet; Future -     Comprehensive metabolic panel; Future -     TSH; Future -     Lipid panel; Future  Encounter for lipid screening for cardiovascular disease -     Cancel: Lipid panel -     Lipid panel; Future  Family history of colon cancer -     Ambulatory referral to Gastroenterology  Colon cancer screening -     Ambulatory referral to  Gastroenterology  Witnessed apneic spells -     Ambulatory referral to Sleep Studies  Loud snoring -     Ambulatory referral to Sleep Studies  Need for diphtheria-tetanus-pertussis (Tdap) vaccine -     Tdap vaccine greater than or equal to 7yo IM    Schedule lab appt. Need to be fasting 8hrs prior to blood draw. Ok to drink water. Minimize alcohol consumption. Work on tobacco cessation. Maintain heart healthy diet. You will be contacted to schedule appt with GI and sleep specialist.  Problem List Items Addressed This Visit       Other   Family history of colon cancer   Relevant Orders   Ambulatory referral to Gastroenterology   Loud snoring   Relevant Orders   Ambulatory referral to Sleep Studies   Witnessed apneic spells    Witnessed by wife and coworkers. Associated with choking episodes. Admits to excessive ETOH consumption (1bottle of brown liquor per week) and daily tobacco use. BP Readings from Last 3 Encounters:  09/29/21 122/78  08/27/18 120/82  11/18/12 122/77   Wt Readings from Last 3 Encounters:  09/29/21 249 lb 6.4 oz (113.1 kg)  08/27/18 249 lb 2 oz (113 kg)   He agreed to referral to [facility]. Order entered Advised about the correlation between obesity, ETOH use and apneic symptoms. Advised about his risk for CAD and CVD. Advised about the importance of weight loss through diet and exercise, and about the recommended ETOH serving for an adult male.      Relevant Orders   Ambulatory referral to Sleep Studies   Other Visit Diagnoses     Encounter for preventative adult health care exam with abnormal findings    -  Primary   Relevant Orders   Ambulatory referral to Gastroenterology   CBC with Differential/Platelet  Comprehensive metabolic panel   TSH   Lipid panel   Encounter for lipid screening for cardiovascular disease       Relevant Orders   Lipid panel   Colon cancer screening       Relevant Orders   Ambulatory referral to Gastroenterology    Need for diphtheria-tetanus-pertussis (Tdap) vaccine       Relevant Orders   Tdap vaccine greater than or equal to 7yo IM (Completed)       Follow up: Return in about 3 months (around 12/28/2021) for tobacco cessation and ETOH use.  Alysia Penna, NP

## 2021-09-29 NOTE — Patient Instructions (Addendum)
Schedule lab appt. Need to be fasting 8hrs prior to blood draw. Ok to drink water. Minimize alcohol consumption. Work on tobacco cessation. Maintain heart healthy diet.  You will be contacted to schedule appt with GI and sleep specialist.  Preventive Care 51-40 Years Old, Male Preventive care refers to lifestyle choices and visits with your health care provider that can promote health and wellness. Preventive care visits are also called wellness exams. What can I expect for my preventive care visit? Counseling During your preventive care visit, your health care provider may ask about your: Medical history, including: Past medical problems. Family medical history. Current health, including: Emotional well-being. Home life and relationship well-being. Sexual activity. Lifestyle, including: Alcohol, nicotine or tobacco, and drug use. Access to firearms. Diet, exercise, and sleep habits. Safety issues such as seatbelt and bike helmet use. Sunscreen use. Work and work Astronomer. Physical exam Your health care provider will check your: Height and weight. These may be used to calculate your BMI (body mass index). BMI is a measurement that tells if you are at a healthy weight. Waist circumference. This measures the distance around your waistline. This measurement also tells if you are at a healthy weight and may help predict your risk of certain diseases, such as type 2 diabetes and high blood pressure. Heart rate and blood pressure. Body temperature. Skin for abnormal spots. What immunizations do I need? Vaccines are usually given at various ages, according to a schedule. Your health care provider will recommend vaccines for you based on your age, medical history, and lifestyle or other factors, such as travel or where you work. What tests do I need? Screening Your health care provider may recommend screening tests for certain conditions. This may include: Lipid and cholesterol  levels. Diabetes screening. This is done by checking your blood sugar (glucose) after you have not eaten for a while (fasting). Hepatitis B test. Hepatitis C test. HIV (human immunodeficiency virus) test. STI (sexually transmitted infection) testing, if you are at risk. Lung cancer screening. Prostate cancer screening. Colorectal cancer screening. Talk with your health care provider about your test results, treatment options, and if necessary, the need for more tests. Follow these instructions at home: Eating and drinking  Eat a diet that includes fresh fruits and vegetables, whole grains, lean protein, and low-fat dairy products. Take vitamin and mineral supplements as recommended by your health care provider. Do not drink alcohol if your health care provider tells you not to drink. If you drink alcohol: Limit how much you have to 0-2 drinks a day. Know how much alcohol is in your drink. In the [place] one drink equals one 12 oz bottle of beer (355 mL), one 5 oz glass of wine (148 mL), or one 1 oz glass of hard liquor (44 mL). Lifestyle Brush your teeth every morning and night with fluoride toothpaste. Floss one time each day. Exercise for at least 30 minutes 5 or more days each week. Do not use any products that contain nicotine or tobacco. These products include cigarettes, chewing tobacco, and vaping devices, such as e-cigarettes. If you need help quitting, ask your health care provider. Do not use drugs. If you are sexually active, practice safe sex. Use a condom or other form of protection to prevent STIs. Take aspirin only as told by your health care provider. Make sure that you understand how much to take and what form to take. Work with your health care provider to find out whether it is safe and  beneficial for you to take aspirin daily. Find healthy ways to manage stress, such as: Meditation, yoga, or listening to music. Journaling. Talking to a trusted person. Spending time  with friends and family. Minimize exposure to UV radiation to reduce your risk of skin cancer. Safety Always wear your seat belt while driving or riding in a vehicle. Do not drive: If you have been drinking alcohol. Do not ride with someone who has been drinking. When you are tired or distracted. While texting. If you have been using any mind-altering substances or drugs. Wear a helmet and other protective equipment during sports activities. If you have firearms in your house, make sure you follow all gun safety procedures. What's next? Go to your health care provider once a year for an annual wellness visit. Ask your health care provider how often you should have your eyes and teeth checked. Stay up to date on all vaccines. This information is not intended to replace advice given to you by your health care provider. Make sure you discuss any questions you have with your health care provider. Document Revised: 04/07/2021 Document Reviewed: 04/07/2021 Elsevier Patient Education  2022 [employer].

## 2021-09-30 ENCOUNTER — Encounter: Payer: Self-pay | Admitting: Nurse Practitioner

## 2021-09-30 ENCOUNTER — Other Ambulatory Visit (INDEPENDENT_AMBULATORY_CARE_PROVIDER_SITE_OTHER): Payer: Managed Care, Other (non HMO)

## 2021-09-30 DIAGNOSIS — Z0001 Encounter for general adult medical examination with abnormal findings: Secondary | ICD-10-CM | POA: Diagnosis not present

## 2021-09-30 DIAGNOSIS — Z720 Tobacco use: Secondary | ICD-10-CM | POA: Insufficient documentation

## 2021-09-30 DIAGNOSIS — F1010 Alcohol abuse, uncomplicated: Secondary | ICD-10-CM | POA: Insufficient documentation

## 2021-09-30 DIAGNOSIS — E781 Pure hyperglyceridemia: Secondary | ICD-10-CM | POA: Diagnosis not present

## 2021-09-30 LAB — COMPREHENSIVE METABOLIC PANEL
ALT: 21 U/L (ref 0–53)
AST: 18 U/L (ref 0–37)
Albumin: 4.2 g/dL (ref 3.5–5.2)
Alkaline Phosphatase: 66 U/L (ref 39–117)
BUN: 18 mg/dL (ref 6–23)
CO2: 27 mEq/L (ref 19–32)
Calcium: 9.3 mg/dL (ref 8.4–10.5)
Chloride: 105 mEq/L (ref 96–112)
Creatinine, Ser: 1.4 mg/dL (ref 0.40–1.50)
GFR: 62.8 mL/min (ref >60.00)
Glucose, Bld: 79 mg/dL (ref 70–99)
Potassium: 4.3 mEq/L (ref 3.5–5.1)
Sodium: 137 mEq/L (ref 135–145)
Total Bilirubin: 0.4 mg/dL (ref 0.2–1.2)
Total Protein: 7.1 g/dL (ref 6.0–8.3)

## 2021-09-30 LAB — TSH: TSH: 1.81 u[IU]/mL (ref 0.35–5.50)

## 2021-09-30 LAB — LDL CHOLESTEROL, DIRECT: Direct LDL: 109 mg/dL

## 2021-09-30 LAB — CBC WITH DIFFERENTIAL/PLATELET
Basophils Absolute: 0.1 10*3/uL (ref 0.0–0.1)
Basophils Relative: 0.5 % (ref 0.0–3.0)
Eosinophils Absolute: 0.1 10*3/uL (ref 0.0–0.7)
Eosinophils Relative: 1 % (ref 0.0–5.0)
HCT: 44.1 % (ref 39.0–52.0)
Hemoglobin: 14.4 g/dL (ref 13.0–17.0)
Lymphocytes Relative: 28.2 % (ref 12.0–46.0)
Lymphs Abs: 3.7 10*3/uL (ref 0.7–4.0)
MCHC: 32.6 g/dL (ref 30.0–36.0)
MCV: 92.3 fl (ref 78.0–100.0)
Monocytes Absolute: 1.1 10*3/uL — ABNORMAL HIGH (ref 0.1–1.0)
Monocytes Relative: 8 % (ref 3.0–12.0)
Neutro Abs: 8.2 10*3/uL — ABNORMAL HIGH (ref 1.4–7.7)
Neutrophils Relative %: 62.3 % (ref 43.0–77.0)
Platelets: 284 10*3/uL (ref 150.0–400.0)
RBC: 4.78 Mil/uL (ref 4.22–5.81)
RDW: 13.5 % (ref 11.5–15.5)
WBC: 13.2 10*3/uL — ABNORMAL HIGH (ref 4.0–10.5)

## 2021-09-30 LAB — LIPID PANEL
Cholesterol: 210 mg/dL — ABNORMAL HIGH (ref 0–200)
HDL: 35.5 mg/dL — ABNORMAL LOW (ref >39.00)
NonHDL: 174.94
Total CHOL/HDL Ratio: 6
Triglycerides: 300 mg/dL — ABNORMAL HIGH (ref 0.0–149.0)
VLDL: 60 mg/dL — ABNORMAL HIGH (ref 0.0–40.0)

## 2021-09-30 MED ORDER — FENOFIBRATE 145 MG PO TABS: 145.0000 mg | ORAL_TABLET | Freq: Every day | ORAL | 1 refills | Status: DC

## 2021-09-30 NOTE — Addendum Note (Signed)
Addended by: Alysia Penna L on: 09/30/2021 03:51 PM   Modules accepted: Orders

## 2021-09-30 NOTE — Assessment & Plan Note (Signed)
Stable cbc, TSH and CMP Abnormal lipid panel due to excess ETOH use and daily tobacco use. You are at risk of diabetes, liver disease, heart and cerebrovascular disease. It is imperative you start by decreasing ETOH consumption to 1shot of liquor or 1-12oz can of beer per day. Maintain heart healthy diet. Start fenofibrate. New rx sent Schedule lipid panel to repeat lipid panel in 9months

## 2021-11-09 ENCOUNTER — Other Ambulatory Visit: Payer: Self-pay

## 2021-11-10 ENCOUNTER — Encounter: Payer: Self-pay | Admitting: Family Medicine

## 2021-11-10 ENCOUNTER — Ambulatory Visit: Payer: Managed Care, Other (non HMO) | Admitting: Family Medicine

## 2021-11-10 VITALS — BP 136/82 | HR 87 | Temp 97.0°F | Ht 67.0 in | Wt 247.2 lb

## 2021-11-10 DIAGNOSIS — E782 Mixed hyperlipidemia: Secondary | ICD-10-CM

## 2021-11-10 DIAGNOSIS — R0683 Snoring: Secondary | ICD-10-CM

## 2021-11-10 DIAGNOSIS — Z789 Other specified health status: Secondary | ICD-10-CM | POA: Diagnosis not present

## 2021-11-10 DIAGNOSIS — F1090 Alcohol use, unspecified, uncomplicated: Secondary | ICD-10-CM

## 2021-11-10 NOTE — Progress Notes (Signed)
Established Patient Office Visit  Subjective:  Patient ID: Omar Shannon, male    DOB: 1981/09/19  Age: 41 y.o. MRN: [id number]  CC:  Chief Complaint  Patient presents with   Follow-up    Follow up on labs, no concerns. Patient fasting.     HPI Omar Shannon presents for follow-up of elevated triglycerides and concerns for alcohol intake.  He admits that this is been a turbulent time in his life.  Recently filed for divorce and was forced to leave the home.  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  His living situation is not stable.  He admitted to drinking 4 servings of alcohol on a daily basis on Sundays.  Life has become more stable since he left his wife.  He is no longer drinking to above extent.  At this point he has 2 servings of alcohol on occasion.  Clinical review shows that his liver enzymes have not been elevated.  He does say that he was fasting for his last blood draw.  Triglycerides were elevated to 300.  As things are unsettled for him now he has been eating fast food.  he was started on Tricor just this past month.  He admits that he has not been taking it regularly. Stress levels have decreased  No past medical history on file.  Past Surgical History:  Procedure Laterality Date   inguinal cyst      Family History  Problem Relation Age of Onset   Hypertension Mother    Cancer Father 21       kidney cancer   Cancer Paternal Grandmother        colon cancer    Social History   Socioeconomic History   Marital status: Married    Spouse name: Not on file   Number of children: 2   Years of education: Not on file   Highest education level: Not on file  Occupational History   Not on file  Tobacco Use   Smoking status: Every Day    Types: Cigars   Smokeless tobacco: Never  Vaping Use   Vaping Use: Never used  Substance and Sexual Activity   Alcohol use: Yes    Alcohol/week: 15.0 standard drinks    Types: 15 Shots of liquor per  week   Drug use: Not Currently    Types: Marijuana    Comment: quit 6weeks ago   Sexual activity: Yes    Birth control/protection: Condom  Other Topics Concern   Not on file  Social History Narrative   Not on file   Social Determinants of Health   Financial Resource Strain: Not on file  Food Insecurity: Not on file  Transportation Needs: Not on file  Physical Activity: Not on file  Stress: Not on file  Social Connections: Not on file  Intimate Partner Violence: Not on file    Outpatient Medications Prior to Visit  Medication Sig Dispense Refill   fenofibrate (TRICOR) 145 MG tablet Take 1 tablet (145 mg total) by mouth daily. 90 tablet 1   No facility-administered medications prior to visit.    No Known Allergies  ROS Review of Systems  Constitutional:  Negative for chills, diaphoresis, fatigue, fever and unexpected weight change.  HENT: Negative.    Eyes:  Negative for photophobia and visual disturbance.  Respiratory: Negative.    Cardiovascular: Negative.   Gastrointestinal: Negative.   Genitourinary: Negative.   Musculoskeletal:  Negative for gait  problem and joint swelling.  Skin: Negative.   Neurological:  Negative for speech difficulty, weakness and light-headedness.  Psychiatric/Behavioral: Negative.       Objective:    Physical Exam Vitals and nursing note reviewed.  Constitutional:      General: He is not in acute distress.    Appearance: Normal appearance. He is obese. He is not ill-appearing, toxic-appearing or diaphoretic.  HENT:     Head: Normocephalic and atraumatic.     Right Ear: External ear normal.     Left Ear: External ear normal.     Mouth/Throat:     Mouth: Mucous membranes are moist.     Pharynx: Oropharynx is clear. No oropharyngeal exudate or posterior oropharyngeal erythema.   Eyes:     General: No scleral icterus.       Right eye: No discharge.        Left eye: No discharge.     Extraocular Movements: Extraocular movements  intact.     Conjunctiva/sclera: Conjunctivae normal.     Pupils: Pupils are equal, round, and reactive to light.  Neck:     Vascular: No carotid bruit.  Cardiovascular:     Rate and Rhythm: Normal rate and regular rhythm.  Pulmonary:     Effort: Pulmonary effort is normal.     Breath sounds: Normal breath sounds.  Abdominal:     General: Bowel sounds are normal.  Musculoskeletal:     Cervical back: No rigidity or tenderness.     Right lower leg: No edema.     Left lower leg: No edema.  Lymphadenopathy:     Cervical: No cervical adenopathy.  Skin:    General: Skin is warm and dry.  Neurological:     Mental Status: He is alert and oriented to person, place, and time.    BP 136/82 (BP Location: Right Arm, Patient Position: Sitting, Cuff Size: Large)    Pulse 87    Temp (!) 97 F (36.1 C) (Temporal)    Ht 5\' 7"  (1.702 m)    Wt 247 lb 3.2 oz (112.1 kg)    SpO2 97%    BMI 38.72 kg/m  Wt Readings from Last 3 Encounters:  11/10/21 247 lb 3.2 oz (112.1 kg)  09/29/21 249 lb 6.4 oz (113.1 kg)  08/27/18 249 lb 2 oz (113 kg)     Health Maintenance Due  Topic Date Due   Pneumococcal Vaccine 49-17 Years old (1 - PCV) Never done   HIV Screening  Never done   Hepatitis C Screening  Never done    There are no preventive care reminders to display for this patient.  Lab Results  Component Value Date   TSH 1.81 09/30/2021   Lab Results  Component Value Date   WBC 13.2 (H) 09/30/2021   HGB 14.4 09/30/2021   HCT 44.1 09/30/2021   MCV 92.3 09/30/2021   PLT 284.0 09/30/2021   Lab Results  Component Value Date   NA 137 09/30/2021   K 4.3 09/30/2021   CO2 27 09/30/2021   GLUCOSE 79 09/30/2021   BUN 18 09/30/2021   CREATININE 1.40 09/30/2021   BILITOT 0.4 09/30/2021   ALKPHOS 66 09/30/2021   AST 18 09/30/2021   ALT 21 09/30/2021   PROT 7.1 09/30/2021   ALBUMIN 4.2 09/30/2021   CALCIUM 9.3 09/30/2021   GFR 62.80 09/30/2021   Lab Results  Component Value Date   CHOL 210  (H) 09/30/2021   Lab Results  Component Value Date  HDL 35.50 (L) 09/30/2021   No results found for: [facility] Lab Results  Component Value Date   TRIG 300.0 (H) 09/30/2021   Lab Results  Component Value Date   CHOLHDL 6 09/30/2021   No results found for: HGBA1C    Assessment & Plan:   Problem List Items Addressed This Visit       Other   Loud snoring - Primary   Elevated triglycerides with high cholesterol   Alcohol use    No orders of the defined types were placed in this encounter.   Follow-up: Return in about 3 months (around 02/08/2022), or Hold Tricor..  Patient agrees to moderate alcohol intake to no more than 2 drinks daily, at that.  Encouraged exercise.  He was given information on the Mediterranean diet.  Asked him to hold Tricor for now.  Consultation for sleep study in March.  Follow-up fasting in 3 months fasting.   Libby Maw, MD

## 2021-12-28 ENCOUNTER — Ambulatory Visit: Payer: Managed Care, Other (non HMO) | Admitting: Nurse Practitioner

## 2022-01-05 ENCOUNTER — Institutional Professional Consult (permissible substitution): Payer: Self-pay | Admitting: Neurology

## 2022-01-31 ENCOUNTER — Encounter: Payer: Self-pay | Admitting: Gastroenterology

## 2022-02-08 ENCOUNTER — Encounter: Payer: Self-pay | Admitting: Family Medicine

## 2022-02-08 ENCOUNTER — Ambulatory Visit: Payer: Managed Care, Other (non HMO) | Admitting: Family Medicine

## 2022-02-08 VITALS — BP 132/80 | HR 100 | Temp 98.1°F | Ht 67.0 in | Wt 248.2 lb

## 2022-02-08 DIAGNOSIS — E782 Mixed hyperlipidemia: Secondary | ICD-10-CM | POA: Diagnosis not present

## 2022-02-08 DIAGNOSIS — Z872 Personal history of diseases of the skin and subcutaneous tissue: Secondary | ICD-10-CM | POA: Diagnosis not present

## 2022-02-08 DIAGNOSIS — L309 Dermatitis, unspecified: Secondary | ICD-10-CM

## 2022-02-08 DIAGNOSIS — F439 Reaction to severe stress, unspecified: Secondary | ICD-10-CM | POA: Diagnosis not present

## 2022-02-08 MED ORDER — CLOTRIMAZOLE-BETAMETHASONE 1-0.05 % EX CREA: TOPICAL_CREAM | CUTANEOUS | 0 refills | Status: DC

## 2022-02-08 NOTE — Progress Notes (Signed)
? ?  Established Patient Office Visit ? ?Subjective   ?Patient ID: Omar Shannon, male    DOB: [birth year]  Age: 41 y.o. MRN: [id number] ? ?Chief Complaint  ?Patient presents with  ? Follow-up  ?  3 month follow up. States that he's had shingles few months ago also having skin rash different areas of body.   ? ? ?HPI 53-month follow-up.  This has been an extremely stressful time for him.  He is proceeding through separation force from his wife.  He developed a case of zoster a few months ago.  It is healed.  He now has a rash in the scrotal area.  History of pilonidal cyst that was surgically excised several years ago.  It becomes inflamed and drains periodically.  History of low back pain and sciatica.  It was relieved with a muscle relaxer.  He is working 2 jobs.  He is living with his mother who is a Ship broker.  It is difficult for him to eat healthy food. ? ? ? ?ROS ? ?  ?Objective:  ?  ? ?BP 132/80 (BP Location: Right Arm, Patient Position: Sitting, Cuff Size: Large)   Pulse 100   Temp 98.1 ?F (36.7 ?C) (Temporal)   Ht 5\' 7"  (1.702 m)   Wt 248 lb 3.2 oz (112.6 kg)   SpO2 97%   BMI 38.87 kg/m?  ? ? ?Physical Exam ?Vitals and nursing note reviewed.  ?Constitutional:   ?   General: He is not in acute distress. ?   Appearance: Normal appearance. He is not ill-appearing, toxic-appearing or diaphoretic.  ?HENT:  ?   Right Ear: External ear normal.  ?   Left Ear: External ear normal.  ?Eyes:  ?   General: No scleral icterus.    ?   Right eye: No discharge.     ?   Left eye: No discharge.  ?   Extraocular Movements: Extraocular movements intact.  ?   Conjunctiva/sclera: Conjunctivae normal.  ?Pulmonary:  ?   Effort: Pulmonary effort is normal.  ?Skin: ?   General: Skin is warm and dry.  ? ?    ?Neurological:  ?   Mental Status: He is alert and oriented to person, place, and time.  ?Psychiatric:     ?   Mood and Affect: Mood normal.     ?   Behavior: Behavior normal.  ? ? ? ?No results found for any visits on  02/08/22. ? ? ? ?The 10-year ASCVD risk score (Arnett DK, et al., 2019) is: 6.7% ? ?  ?Assessment & Plan:  ? ?Problem List Items Addressed This Visit   ? ?  ? Musculoskeletal and Integument  ? Dermatitis  ? Relevant Medications  ? clotrimazole-betamethasone (LOTRISONE) cream  ?  ? Other  ? Elevated triglycerides with high cholesterol - Primary  ? Stress  ? History of pilonidal cyst  ? ? ?Return in about 3 months (around 05/10/2022).  ?He will do his best to eat a healthier diet.  Lotrisone cream applied sparingly daily for 1 week should clear up his rash.  Let me know if it does not.  Asked him to return with return of lower back pain. ? ?Libby Maw, MD ? ?

## 2022-02-14 ENCOUNTER — Encounter: Payer: Self-pay | Admitting: Neurology

## 2022-02-14 ENCOUNTER — Institutional Professional Consult (permissible substitution): Payer: Self-pay | Admitting: Neurology

## 2022-02-16 ENCOUNTER — Ambulatory Visit: Payer: Managed Care, Other (non HMO) | Admitting: Gastroenterology

## 2022-05-13 ENCOUNTER — Ambulatory Visit: Payer: Managed Care, Other (non HMO) | Admitting: Family Medicine

## 2022-05-13 ENCOUNTER — Encounter: Payer: Self-pay | Admitting: Family Medicine

## 2022-05-13 VITALS — BP 134/72 | HR 97 | Temp 97.2°F | Ht 67.0 in | Wt 252.6 lb

## 2022-05-13 DIAGNOSIS — N529 Male erectile dysfunction, unspecified: Secondary | ICD-10-CM | POA: Diagnosis not present

## 2022-05-13 DIAGNOSIS — E782 Mixed hyperlipidemia: Secondary | ICD-10-CM

## 2022-05-13 DIAGNOSIS — F4321 Adjustment disorder with depressed mood: Secondary | ICD-10-CM | POA: Diagnosis not present

## 2022-05-13 MED ORDER — SILDENAFIL CITRATE 20 MG PO TABS: ORAL_TABLET | ORAL | 1 refills | Status: AC

## 2022-05-13 NOTE — Progress Notes (Signed)
Established Patient Office Visit  Subjective   Patient ID: Omar Shannon, male    DOB: [birth year]  Age: 41 y.o. MRN: [id number]  Chief Complaint  Patient presents with  . Follow-up    3 month follow     HPI follow-up of elevated triglycerides, situational stress/depression obesity.  Has had some issue with ADD.  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  Separation continues to be stressful.  He is not exercising.  He is trying to improve his diet with little success.  Continues to be highly stressed.    Review of Systems  Constitutional: Negative.   HENT: Negative.    Eyes:  Negative for blurred vision, discharge and redness.  Respiratory: Negative.    Cardiovascular: Negative.   Gastrointestinal:  Negative for abdominal pain.  Genitourinary: Negative.   Musculoskeletal: Negative.  Negative for myalgias.  Skin:  Negative for rash.  Neurological:  Negative for tingling, loss of consciousness and weakness.  Endo/Heme/Allergies:  Negative for polydipsia.      05/13/2022    4:17 PM 02/08/2022    3:56 PM 11/10/2021    8:14 AM  Depression screen PHQ 2/9  Decreased Interest 0 0 0  Down, Depressed, Hopeless 0 0 0  PHQ - 2 Score 0 0 0       Objective:     BP 134/72 (BP Location: Right Arm, Patient Position: Sitting, Cuff Size: Large)   Pulse 97   Temp (!) 97.2 F (36.2 C) (Temporal)   Ht 5\' 7"  (1.702 m)   Wt 252 lb 9.6 oz (114.6 kg)   SpO2 97%   BMI 39.56 kg/m  Wt Readings from Last 3 Encounters:  05/13/22 252 lb 9.6 oz (114.6 kg)  02/08/22 248 lb 3.2 oz (112.6 kg)  11/10/21 247 lb 3.2 oz (112.1 kg)      Physical Exam Constitutional:      General: He is not in acute distress.    Appearance: Normal appearance. He is not ill-appearing, toxic-appearing or diaphoretic.  HENT:     Head: Normocephalic and atraumatic.     Right Ear: External ear normal.     Left Ear: External ear normal.  Eyes:     General: No scleral icterus.       Right eye: No  discharge.        Left eye: No discharge.     Extraocular Movements: Extraocular movements intact.     Conjunctiva/sclera: Conjunctivae normal.  Pulmonary:     Effort: Pulmonary effort is normal. No respiratory distress.  Skin:    General: Skin is warm and dry.  Neurological:     Mental Status: He is alert and oriented to person, place, and time.  Psychiatric:        Mood and Affect: Mood normal.        Behavior: Behavior normal.     No results found for any visits on 05/13/22.    The 10-year ASCVD risk score (Arnett DK, et al., 2019) is: 6.8%    Assessment & Plan:   Problem List Items Addressed This Visit       Other   Morbid obesity (HCC)   Elevated triglycerides with high cholesterol   Relevant Medications   sildenafil (REVATIO) 20 MG tablet   Erectile dysfunction   Relevant Medications   sildenafil (REVATIO) 20 MG tablet   Situational depression - Primary    Return in about 4 months (around 09/13/2022).   We will seek counseling  with employee assistance.  We will seek nutritional counseling through that which is provided by his insurance company.  Encouraged exercise with walking for at least 30 minutes 5 days weekly.  He and I both believe that is a ED is probably secondary to stress.  But there are other factors that could be involved. Mliss Sax, MD

## 2022-07-05 ENCOUNTER — Encounter: Payer: Self-pay | Admitting: Family Medicine

## 2022-09-06 ENCOUNTER — Ambulatory Visit: Payer: Managed Care, Other (non HMO) | Admitting: Family Medicine

## 2022-09-06 ENCOUNTER — Encounter: Payer: Self-pay | Admitting: Family Medicine

## 2022-09-06 DIAGNOSIS — F439 Reaction to severe stress, unspecified: Secondary | ICD-10-CM | POA: Diagnosis not present

## 2022-09-06 DIAGNOSIS — M545 Low back pain, unspecified: Secondary | ICD-10-CM

## 2022-09-06 MED ORDER — NAPROXEN 500 MG PO TABS: 500.0000 mg | ORAL_TABLET | Freq: Two times a day (BID) | ORAL | 0 refills | Status: DC | PRN

## 2022-09-06 MED ORDER — METHOCARBAMOL 500 MG PO TABS: 500.0000 mg | ORAL_TABLET | Freq: Three times a day (TID) | ORAL | 0 refills | Status: DC | PRN

## 2022-09-06 NOTE — Progress Notes (Signed)
Established Patient Office Visit  Subjective   Patient ID: Omar Shannon, male    DOB: 01-Nov-1980  Age: 41 y.o. MRN: [id number]  Chief Complaint  Patient presents with   Follow-up    4 month follow up no concerns.     HPI significant stress continues with the ongoing separation from his wife.  He developed some mild swelling in his legs after standing for long.'s.  Swelling resolves by the morning after he sleeps.  Ongoing intermittent lower back pain again after working long hours.    Review of Systems  Constitutional: Negative.   HENT: Negative.    Eyes:  Negative for blurred vision, discharge and redness.  Respiratory: Negative.    Cardiovascular: Negative.   Gastrointestinal:  Negative for abdominal pain.  Genitourinary: Negative.   Musculoskeletal:  Positive for back pain. Negative for myalgias.  Skin:  Negative for rash.  Neurological:  Negative for tingling, loss of consciousness and weakness.  Endo/Heme/Allergies:  Negative for polydipsia.         09/06/2022    3:05 PM 05/13/2022    4:58 PM 05/13/2022    4:17 PM  Depression screen PHQ 2/9  Decreased Interest 0 1 0  Down, Depressed, Hopeless 0 0 0  PHQ - 2 Score 0 1 0  Altered sleeping  2   Tired, decreased energy  1   Change in appetite  3   Feeling bad or failure about yourself   1   Trouble concentrating  1   Moving slowly or fidgety/restless  1   Suicidal thoughts  0   PHQ-9 Score  10   Difficult doing work/chores  Not difficult at all      Objective:     BP (!) 148/78 (BP Location: Right Arm, Patient Position: Sitting, Cuff Size: Large)   Pulse (!) 104   Temp 97.8 F (36.6 C) (Temporal)   Ht 5\' 7"  (1.702 m)   Wt 268 lb 9.6 oz (121.8 kg)   SpO2 97%   BMI 42.07 kg/m  BP Readings from Last 3 Encounters:  09/06/22 (!) 148/78  05/13/22 134/72  02/08/22 132/80   Wt Readings from Last 3 Encounters:  09/06/22 268 lb 9.6 oz (121.8 kg)  05/13/22 252 lb 9.6 oz (114.6 kg)  02/08/22 248 lb 3.2  oz (112.6 kg)      Physical Exam Constitutional:      General: He is not in acute distress.    Appearance: Normal appearance. He is not ill-appearing, toxic-appearing or diaphoretic.  HENT:     Head: Normocephalic and atraumatic.     Right Ear: External ear normal.     Left Ear: External ear normal.     Mouth/Throat:     Mouth: Mucous membranes are moist.     Pharynx: Oropharynx is clear. No oropharyngeal exudate or posterior oropharyngeal erythema.  Eyes:     General: No scleral icterus.       Right eye: No discharge.        Left eye: No discharge.     Extraocular Movements: Extraocular movements intact.     Conjunctiva/sclera: Conjunctivae normal.     Pupils: Pupils are equal, round, and reactive to light.  Cardiovascular:     Rate and Rhythm: Normal rate and regular rhythm.  Pulmonary:     Effort: Pulmonary effort is normal. No respiratory distress.     Breath sounds: Normal breath sounds.  Abdominal:     General: Bowel sounds are normal.  Tenderness: There is no abdominal tenderness. There is no guarding.  Musculoskeletal:     Cervical back: No rigidity or tenderness.     Right lower leg: Edema (trace) present.     Left lower leg: Edema (trace) present.  Skin:    General: Skin is warm and dry.  Neurological:     Mental Status: He is alert and oriented to person, place, and time.  Psychiatric:        Mood and Affect: Mood normal.        Behavior: Behavior normal.      No results found for any visits on 09/06/22.    The 10-year ASCVD risk score (Arnett DK, et al., 2019) is: 8.1%    Assessment & Plan:   Problem List Items Addressed This Visit       Other   Morbid obesity (HCC) - Primary   Stress   Other Visit Diagnoses     Low back pain without sciatica, unspecified back pain laterality, unspecified chronicity       Relevant Medications   naproxen (NAPROSYN) 500 MG tablet   methocarbamol (ROBAXIN) 500 MG tablet       Return in about 6 months  (around 03/07/2023).  Naprosyn and Robaxin to be used as needed for back pain.  Information was given on mindfulness based stress reduction.  He has been planning on going to the gym.  He is working on modifying his diet to lose weight.  Information was given on exercising to lose weight.  Difficult for him to obtain a sleep study at this time.  Mliss Sax, MD

## 2022-09-27 ENCOUNTER — Ambulatory Visit: Payer: Managed Care, Other (non HMO) | Admitting: Family Medicine

## 2022-09-27 ENCOUNTER — Encounter: Payer: Self-pay | Admitting: Family Medicine

## 2022-09-27 ENCOUNTER — Telehealth: Payer: Self-pay | Admitting: Family Medicine

## 2022-09-27 DIAGNOSIS — D72828 Other elevated white blood cell count: Secondary | ICD-10-CM

## 2022-09-27 DIAGNOSIS — R030 Elevated blood-pressure reading, without diagnosis of hypertension: Secondary | ICD-10-CM | POA: Diagnosis not present

## 2022-09-27 DIAGNOSIS — K921 Melena: Secondary | ICD-10-CM | POA: Diagnosis not present

## 2022-09-27 DIAGNOSIS — F439 Reaction to severe stress, unspecified: Secondary | ICD-10-CM

## 2022-09-27 LAB — CBC
HCT: 45.8 % (ref 39.0–52.0)
Hemoglobin: 15.1 g/dL (ref 13.0–17.0)
MCHC: 33 g/dL (ref 30.0–36.0)
MCV: 90 fl (ref 78.0–100.0)
Platelets: 333 10*3/uL (ref 150.0–400.0)
RBC: 5.09 Mil/uL (ref 4.22–5.81)
RDW: 13.2 % (ref 11.5–15.5)
WBC: 14.8 10*3/uL — ABNORMAL HIGH (ref 4.0–10.5)

## 2022-09-27 NOTE — Telephone Encounter (Signed)
error 

## 2022-09-27 NOTE — Progress Notes (Addendum)
Established Patient Office Visit   Subjective:  Patient ID: Omar Shannon, male    DOB: November 14, 1980  Age: 41 y.o. MRN: [id number]  Chief Complaint  Patient presents with   Blood In Stools    Blood in stool last week x 4 days. None today no other symptoms.     HPI  Encounter Diagnoses  Name Primary?   Morbid obesity (HCC) Yes   Stress    Elevated BP without diagnosis of hypertension    Hematochezia    Granulocytosis    Here for evaluation of spontaneous hematochezia 2 weeks ago.  No further episodes.  Denies history of constipation, painful bowel movements or prolonged toilet bowl sitting.  He has never had issues with hemorrhoids.  There has been no abdominal pain.  He is gaining weight.  There have been no night sweats.   Review of Systems  Constitutional: Negative.   HENT: Negative.    Eyes:  Negative for blurred vision, discharge and redness.  Respiratory: Negative.    Cardiovascular: Negative.   Gastrointestinal:  Positive for blood in stool. Negative for abdominal pain, constipation, diarrhea and melena.  Genitourinary: Negative.   Musculoskeletal: Negative.  Negative for myalgias.  Skin:  Negative for rash.  Neurological:  Negative for tingling, loss of consciousness and weakness.  Endo/Heme/Allergies:  Negative for polydipsia.     Current Outpatient Medications:    methocarbamol (ROBAXIN) 500 MG tablet, Take 1 tablet (500 mg total) by mouth every 8 (eight) hours as needed for muscle spasms. (Patient not taking: Reported on 09/27/2022), Disp: 50 tablet, Rfl: 0   naproxen (NAPROSYN) 500 MG tablet, Take 1 tablet (500 mg total) by mouth 2 (two) times daily as needed. (Patient not taking: Reported on 09/27/2022), Disp: 30 tablet, Rfl: 0   sildenafil (REVATIO) 20 MG tablet, May take 2-3 45 minutes prior (Patient not taking: Reported on 09/27/2022), Disp: 25 tablet, Rfl: 1   Objective:     BP (!) 142/88 (BP Location: Right Arm, Patient Position: Sitting, Cuff Size:  Large)   Pulse 91   Temp (!) 97.3 F (36.3 C) (Temporal)   Ht 5\' 7"  (1.702 m)   Wt 269 lb (122 kg)   SpO2 99%   BMI 42.13 kg/m  BP Readings from Last 3 Encounters:  09/27/22 (!) 142/88  09/06/22 (!) 141/84  05/13/22 134/72   Wt Readings from Last 3 Encounters:  09/27/22 269 lb (122 kg)  09/06/22 268 lb 9.6 oz (121.8 kg)  05/13/22 252 lb 9.6 oz (114.6 kg)      Physical Exam Constitutional:      General: He is not in acute distress.    Appearance: Normal appearance. He is not ill-appearing, toxic-appearing or diaphoretic.  HENT:     Head: Normocephalic and atraumatic.     Right Ear: External ear normal.     Left Ear: External ear normal.  Eyes:     General: No scleral icterus.       Right eye: No discharge.        Left eye: No discharge.     Extraocular Movements: Extraocular movements intact.     Conjunctiva/sclera: Conjunctivae normal.  Cardiovascular:     Rate and Rhythm: Normal rate and regular rhythm.  Pulmonary:     Effort: Pulmonary effort is normal. No respiratory distress.     Breath sounds: Normal breath sounds.  Abdominal:     General: Bowel sounds are normal. There is no distension.     Tenderness: There  is no abdominal tenderness. There is no guarding or rebound.  Genitourinary:    Prostate: Not tender and no nodules present.     Rectum: Guaiac result negative. No mass, tenderness, anal fissure, external hemorrhoid or internal hemorrhoid. Normal anal tone.  Skin:    General: Skin is warm and dry.  Neurological:     Mental Status: He is alert and oriented to person, place, and time.  Psychiatric:        Mood and Affect: Mood normal.        Behavior: Behavior normal.      Results for orders placed or performed in visit on 09/27/22  CBC  Result Value Ref Range   WBC 14.8 (H) 4.0 - 10.5 K/uL   RBC 5.09 4.22 - 5.81 Mil/uL   Platelets 333.0 150.0 - 400.0 K/uL   Hemoglobin 15.1 13.0 - 17.0 g/dL   HCT 81.8 56.3 - 14.9 %   MCV 90.0 78.0 - 100.0 fl    MCHC 33.0 30.0 - 36.0 g/dL   RDW 70.2 63.7 - 85.8 %      The 10-year ASCVD risk score (Arnett DK, et al., 2019) is: 7.6%    Assessment & Plan:   Morbid obesity (HCC)  Stress  Elevated BP without diagnosis of hypertension  Hematochezia -     Ambulatory referral to Gastroenterology -     CBC  Granulocytosis -     Lactate dehydrogenase; Future    Return in about 3 months (around 12/27/2022).  Encouraged weight loss and encouraged regular exercise such as walking.  Work wonders for his weight and peace of mind.  Encouraged moderation of alcohol intake to no more than 2 drinks daily.  Information was given on exercising to lose weight.  Information was given on preventing hypertension.  There was no obvious source for hematochezia on today's rectal exam.  Hemoccult is negative.  GI referral for evaluation.   Mliss Sax, MD

## 2022-09-30 DIAGNOSIS — D72828 Other elevated white blood cell count: Secondary | ICD-10-CM | POA: Insufficient documentation

## 2022-09-30 NOTE — Addendum Note (Signed)
Addended by: Andrez Grime on: 09/30/2022 01:17 PM   Modules accepted: Orders

## 2022-10-04 ENCOUNTER — Other Ambulatory Visit: Payer: Managed Care, Other (non HMO)

## 2022-10-04 DIAGNOSIS — D72828 Other elevated white blood cell count: Secondary | ICD-10-CM

## 2022-10-05 LAB — LACTATE DEHYDROGENASE: LDH: 100 U/L (ref 100–220)

## 2022-12-27 ENCOUNTER — Encounter: Payer: Self-pay | Admitting: Family Medicine

## 2022-12-27 ENCOUNTER — Ambulatory Visit: Payer: Managed Care, Other (non HMO) | Admitting: Family Medicine

## 2022-12-27 VITALS — BP 120/86 | HR 97 | Temp 98.3°F | Ht 67.0 in | Wt 268.0 lb

## 2022-12-27 DIAGNOSIS — K921 Melena: Secondary | ICD-10-CM

## 2022-12-27 DIAGNOSIS — Z789 Other specified health status: Secondary | ICD-10-CM | POA: Diagnosis not present

## 2022-12-27 DIAGNOSIS — D72829 Elevated white blood cell count, unspecified: Secondary | ICD-10-CM | POA: Diagnosis not present

## 2022-12-27 DIAGNOSIS — F439 Reaction to severe stress, unspecified: Secondary | ICD-10-CM

## 2022-12-27 DIAGNOSIS — D72828 Other elevated white blood cell count: Secondary | ICD-10-CM

## 2022-12-27 LAB — CBC WITH DIFFERENTIAL/PLATELET
Basophils Absolute: 0.1 10*3/uL (ref 0.0–0.1)
Basophils Relative: 0.5 % (ref 0.0–3.0)
Eosinophils Absolute: 0.1 10*3/uL (ref 0.0–0.7)
Eosinophils Relative: 1 % (ref 0.0–5.0)
HCT: 44.4 % (ref 39.0–52.0)
Hemoglobin: 14.5 g/dL (ref 13.0–17.0)
Lymphocytes Relative: 29 % (ref 12.0–46.0)
Lymphs Abs: 4 10*3/uL (ref 0.7–4.0)
MCHC: 32.7 g/dL (ref 30.0–36.0)
MCV: 88.9 fl (ref 78.0–100.0)
Monocytes Absolute: 1.1 10*3/uL — ABNORMAL HIGH (ref 0.1–1.0)
Monocytes Relative: 7.6 % (ref 3.0–12.0)
Neutro Abs: 8.6 10*3/uL — ABNORMAL HIGH (ref 1.4–7.7)
Neutrophils Relative %: 61.9 % (ref 43.0–77.0)
Platelets: 337 10*3/uL (ref 150.0–400.0)
RBC: 5 Mil/uL (ref 4.22–5.81)
RDW: 13.6 % (ref 11.5–15.5)
WBC: 13.9 10*3/uL — ABNORMAL HIGH (ref 4.0–10.5)

## 2022-12-27 NOTE — Progress Notes (Signed)
Established Patient Office Visit   Subjective:  Patient ID: Omar Shannon, male    DOB: August 10, 1981  Age: 42 y.o. MRN: [id number]  Chief Complaint  Patient presents with   Medical Management of Chronic Issues    3 month follow up, no concerns.     HPI Encounter Diagnoses  Name Primary?   Alcohol use Yes   Stress    Hematochezia    Leukocytosis, unspecified type    For follow-up of above.  Has worked on being less stressed and has been successful.  He is drinking less.  He is no longer seeing any blood in his stool.  GI called him about a consultation appointment this past Friday.  He feels well otherwise.  No fevers chills night sweats.   Review of Systems  Constitutional: Negative.   HENT: Negative.    Eyes:  Negative for blurred vision, discharge and redness.  Respiratory: Negative.    Cardiovascular: Negative.   Gastrointestinal:  Negative for abdominal pain.  Genitourinary: Negative.   Musculoskeletal: Negative.  Negative for myalgias.  Skin:  Negative for rash.  Neurological:  Negative for tingling, loss of consciousness and weakness.  Endo/Heme/Allergies:  Negative for polydipsia.     Current Outpatient Medications:    methocarbamol (ROBAXIN) 500 MG tablet, Take 1 tablet (500 mg total) by mouth every 8 (eight) hours as needed for muscle spasms. (Patient not taking: Reported on 09/27/2022), Disp: 50 tablet, Rfl: 0   naproxen (NAPROSYN) 500 MG tablet, Take 1 tablet (500 mg total) by mouth 2 (two) times daily as needed. (Patient not taking: Reported on 09/27/2022), Disp: 30 tablet, Rfl: 0   sildenafil (REVATIO) 20 MG tablet, May take 2-3 45 minutes prior (Patient not taking: Reported on 09/27/2022), Disp: 25 tablet, Rfl: 1   Objective:     BP 120/86 (BP Location: Right Arm, Patient Position: Sitting, Cuff Size: Normal)   Pulse 97   Temp 98.3 F (36.8 C) (Temporal)   Ht '5\' 7"'$  (1.702 m)   Wt 268 lb (121.6 kg)   SpO2 96%   BMI 41.97 kg/m  Wt Readings from Last 3  Encounters:  12/27/22 268 lb (121.6 kg)  09/27/22 269 lb (122 kg)  09/06/22 268 lb 9.6 oz (121.8 kg)      Physical Exam Constitutional:      General: He is not in acute distress.    Appearance: Normal appearance. He is not ill-appearing, toxic-appearing or diaphoretic.  HENT:     Head: Normocephalic and atraumatic.     Right Ear: External ear normal.     Left Ear: External ear normal.  Eyes:     General: No scleral icterus.       Right eye: No discharge.        Left eye: No discharge.     Extraocular Movements: Extraocular movements intact.     Conjunctiva/sclera: Conjunctivae normal.  Pulmonary:     Effort: Pulmonary effort is normal. No respiratory distress.  Skin:    General: Skin is warm and dry.  Neurological:     Mental Status: He is alert and oriented to person, place, and time.  Psychiatric:        Mood and Affect: Mood normal.        Behavior: Behavior normal.      No results found for any visits on 12/27/22.    The 10-year ASCVD risk score (Arnett DK, et al., 2019) is: 5.6%    Assessment & Plan:   Alcohol  use  Stress  Hematochezia  Leukocytosis, unspecified type -     CBC with Differential/Platelet    Return in about 6 months (around 06/29/2023).  Will return the phone call from GI and go ahead and schedule an appointment.  With his history of consultation is still indicated.  Information was given on exercising to lose weight and encouraged him to do both.  Continue limiting alcohol intake to no more than two 2 ounce servings daily.  Libby Maw, MD

## 2022-12-29 NOTE — Addendum Note (Signed)
Addended by: Jon Billings on: 12/29/2022 04:40 PM   Modules accepted: Orders

## 2022-12-30 ENCOUNTER — Telehealth: Payer: Self-pay | Admitting: Family Medicine

## 2022-12-30 NOTE — Telephone Encounter (Signed)
Patient called in requesting a phone call regarding his high wbc levels. He stated that the last few times he has had his blood drawn the levels have been high including this last time,and he would like to just ask a question regarding it.

## 2023-01-02 ENCOUNTER — Other Ambulatory Visit: Payer: Self-pay

## 2023-01-02 ENCOUNTER — Other Ambulatory Visit (INDEPENDENT_AMBULATORY_CARE_PROVIDER_SITE_OTHER): Payer: Managed Care, Other (non HMO)

## 2023-01-02 DIAGNOSIS — D72829 Elevated white blood cell count, unspecified: Secondary | ICD-10-CM | POA: Diagnosis not present

## 2023-01-02 DIAGNOSIS — D72828 Other elevated white blood cell count: Secondary | ICD-10-CM

## 2023-01-02 LAB — LACTATE DEHYDROGENASE: LDH: 98 U/L — ABNORMAL LOW (ref 100–220)

## 2023-01-02 NOTE — Progress Notes (Signed)
Error

## 2023-01-02 NOTE — Telephone Encounter (Signed)
Order is already place and pt will be coming in today by 4pm to get it recheck

## 2023-01-02 NOTE — Progress Notes (Signed)
Pt is here for labs   

## 2023-01-02 NOTE — Addendum Note (Signed)
Addended by: Beryle Lathe S on: 01/02/2023 04:13 PM   Modules accepted: Orders

## 2023-01-03 ENCOUNTER — Encounter: Payer: Self-pay | Admitting: Internal Medicine

## 2023-01-03 ENCOUNTER — Other Ambulatory Visit (INDEPENDENT_AMBULATORY_CARE_PROVIDER_SITE_OTHER): Payer: Managed Care, Other (non HMO)

## 2023-01-03 ENCOUNTER — Ambulatory Visit: Payer: Managed Care, Other (non HMO) | Admitting: Internal Medicine

## 2023-01-03 VITALS — BP 138/84 | HR 92 | Ht 67.0 in | Wt 274.0 lb

## 2023-01-03 DIAGNOSIS — K625 Hemorrhage of anus and rectum: Secondary | ICD-10-CM | POA: Diagnosis not present

## 2023-01-03 DIAGNOSIS — Z789 Other specified health status: Secondary | ICD-10-CM

## 2023-01-03 DIAGNOSIS — K219 Gastro-esophageal reflux disease without esophagitis: Secondary | ICD-10-CM | POA: Diagnosis not present

## 2023-01-03 LAB — COMPREHENSIVE METABOLIC PANEL
ALT: 19 U/L (ref 0–53)
AST: 16 U/L (ref 0–37)
Albumin: 4 g/dL (ref 3.5–5.2)
Alkaline Phosphatase: 77 U/L (ref 39–117)
BUN: 15 mg/dL (ref 6–23)
CO2: 25 mEq/L (ref 19–32)
Calcium: 9.4 mg/dL (ref 8.4–10.5)
Chloride: 104 mEq/L (ref 96–112)
Creatinine, Ser: 1.22 mg/dL (ref 0.40–1.50)
GFR: 73.43 mL/min (ref >60.00)
Glucose, Bld: 104 mg/dL — ABNORMAL HIGH (ref 70–99)
Potassium: 3.9 mEq/L (ref 3.5–5.1)
Sodium: 138 mEq/L (ref 135–145)
Total Bilirubin: 0.4 mg/dL (ref 0.2–1.2)
Total Protein: 6.9 g/dL (ref 6.0–8.3)

## 2023-01-03 LAB — HIGH SENSITIVITY CRP: CRP, High Sensitivity: 3.57 mg/L (ref 0.000–5.000)

## 2023-01-03 NOTE — Progress Notes (Addendum)
Chief Complaint: Rectal bleeding  HPI : 42 year old male with history of alcohol use, GERD, and pilonidal cyst s/p partial excision presents with rectal bleeding  He saw a lot of rectal bleeding about 1.5 months ago, which improved over the course of 3 days. Denies rectal pain. Denies constipation or straining. He has one soft stool per day. Denies abdominal pain. Endorses weight gain. Denies N&V or dysphagia. Sometimes he will have some GERD that feels like regurgitation. He drinks 5 hard liquors per day and has been doing so for the last 4 years. Denies prior colonoscopy. His grandmother died of colon cancer in her early 7s.   Past Medical History:  Diagnosis Date   No pertinent past medical history    Past Surgical History:  Procedure Laterality Date   PILONIDAL CYST EXCISION     Family History  Problem Relation Age of Onset   Hypertension Mother    Kidney cancer Father 30   Colon cancer Paternal Grandmother    Colon polyps Paternal Aunt    Hydrocephalus Daughter    Cerebral palsy Daughter    Social History   Tobacco Use   Smoking status: Every Day    Types: Cigars, Cigarettes   Smokeless tobacco: Never  Vaping Use   Vaping Use: Never used  Substance Use Topics   Alcohol use: Yes    Alcohol/week: 15.0 standard drinks of alcohol    Types: 15 Shots of liquor per week    Comment: 1 per day   Drug use: Not Currently    Types: Marijuana    Comment: quit 6weeks ago   Current Outpatient Medications  Medication Sig Dispense Refill   methocarbamol (ROBAXIN) 500 MG tablet Take 1 tablet (500 mg total) by mouth every 8 (eight) hours as needed for muscle spasms. (Patient not taking: Reported on 09/27/2022) 50 tablet 0   naproxen (NAPROSYN) 500 MG tablet Take 1 tablet (500 mg total) by mouth 2 (two) times daily as needed. (Patient not taking: Reported on 09/27/2022) 30 tablet 0   sildenafil (REVATIO) 20 MG tablet May take 2-3 45 minutes prior (Patient not taking: Reported on  09/27/2022) 25 tablet 1   No current facility-administered medications for this visit.   No Known Allergies   Review of Systems: All systems reviewed and negative except where noted in HPI.   Physical Exam: BP 138/84 (BP Location: Left Arm, Patient Position: Sitting, Cuff Size: Normal)   Pulse 92   Ht '5\' 7"'$  (1.702 m)   Wt 274 lb (124.3 kg)   BMI 42.91 kg/m  Constitutional: Pleasant,well-developed, male in no acute distress. HEENT: Normocephalic and atraumatic. Conjunctivae are normal. No scleral icterus. Cardiovascular: Normal rate, regular rhythm.  Pulmonary/chest: Effort normal and breath sounds normal. No wheezing, rales or rhonchi. Abdominal: Soft, nondistended, nontender. Bowel sounds active throughout. There are no masses palpable. No hepatomegaly. Extremities: No edema Neurological: Alert and oriented to person place and time. Skin: Skin is warm and dry. No rashes noted. Psychiatric: Normal mood and affect. Behavior is normal.  Labs 09/2021: CMP nml.   Labs 12/2022: CBC with elevated WBC of 13.9.  ASSESSMENT AND PLAN: History of rectal bleeding Alcohol use GERD Patient presents with an episode of rectal bleeding that occurred about 1.5 months ago.  He has not had any recurrent rectal bleeding since then.  Because he has never had a colonoscopy in the past, I did recommend that he get a colonoscopy for further evaluation of his source of his rectal  bleeding.  Differential for rectal bleeding includes hemorrhoidal bleeding, colon cancer, and/or IBD.  He had a CBC that was checked recently that did not show a substantial amount of blood loss.  Because patient did endorse a significant amount of routine alcohol use, will check his liver test today to ensure that he is not developing elevated LFTs.  Will also check an inflammatory marker that can sometimes be elevated in the setting of IBD. Patient does sometimes describe GERD so went over some conservative strategies to help with  reflux. - GERD handout - Check CMP and CRP - Patient declined to schedule a colonoscopy today and will contact his insurance company to see about costs of the procedure. He will let us know if he wishes to move forward with scheduling the colonoscopy procedure.  Christia Reading, MD  I spent 47 minutes of time, including in depth chart review, independent review of results as outlined above, communicating results with the patient directly, face-to-face time with the patient, coordinating care, ordering studies and medications as appropriate, and documentation.

## 2023-01-03 NOTE — Patient Instructions (Addendum)
It has been recommended to you by your physician that you have a(n) Colonoscopy completed. Per your request, we did not schedule the procedure(s) today. Please contact our office at [phone] should you decide to have the procedure completed. You will be scheduled for a pre-visit and procedure at that time.   Your provider has requested that you go to the basement level for lab work before leaving today. Press "B" on the elevator. The lab is located at the first door on the left as you exit the elevator.   _______________________________________________________  If your blood pressure at your visit was 140/90 or greater, please contact your primary care physician to follow up on this.  _______________________________________________________  If you are age 42 or older, your body mass index should be between 23-30. Your Body mass index is 42.91 kg/m. If this is out of the aforementioned range listed, please consider follow up with your Primary Care Provider.  If you are age 80 or younger, your body mass index should be between 19-25. Your Body mass index is 42.91 kg/m. If this is out of the aformentioned range listed, please consider follow up with your Primary Care Provider.   ________________________________________________________  The [facility] providers would like to encourage you to use [facility] to communicate with providers for non-urgent requests or questions.  Due to long hold times on the telephone, sending your provider a message by [facility] may be a faster and more efficient way to get a response.  Please allow 48 business hours for a response.  Please remember that this is for non-urgent requests.   Due to recent changes in healthcare laws, you may see the results of your imaging and laboratory studies on MyChart before your provider has had a chance to review them.  We understand that in some cases there may be results that are confusing or concerning to you. Not all laboratory  results come back in the same time frame and the provider may be waiting for multiple results in order to interpret others.  Please give [place] 48 hours in order for your provider to thoroughly review all the results before contacting the office for clarification of your results.    Thank you for entrusting me with your care and for choosing [facility],  Dr. Christia Reading

## 2023-01-09 NOTE — Addendum Note (Signed)
Addended by: Jon Billings on: 01/09/2023 07:59 AM   Modules accepted: Orders

## 2023-01-13 ENCOUNTER — Inpatient Hospital Stay: Payer: Managed Care, Other (non HMO)

## 2023-01-13 ENCOUNTER — Other Ambulatory Visit: Payer: Self-pay | Admitting: Family

## 2023-01-13 ENCOUNTER — Other Ambulatory Visit: Payer: Self-pay

## 2023-01-13 ENCOUNTER — Inpatient Hospital Stay: Payer: Managed Care, Other (non HMO) | Attending: Hematology & Oncology

## 2023-01-13 ENCOUNTER — Encounter: Payer: Self-pay | Admitting: Family

## 2023-01-13 ENCOUNTER — Inpatient Hospital Stay: Payer: Managed Care, Other (non HMO) | Admitting: Family

## 2023-01-13 VITALS — BP 123/69 | HR 83 | Temp 97.8°F | Resp 18 | Ht 67.0 in | Wt 273.8 lb

## 2023-01-13 DIAGNOSIS — F1010 Alcohol abuse, uncomplicated: Secondary | ICD-10-CM | POA: Insufficient documentation

## 2023-01-13 DIAGNOSIS — D72829 Elevated white blood cell count, unspecified: Secondary | ICD-10-CM | POA: Insufficient documentation

## 2023-01-13 DIAGNOSIS — F17210 Nicotine dependence, cigarettes, uncomplicated: Secondary | ICD-10-CM | POA: Diagnosis not present

## 2023-01-13 LAB — CMP (CANCER CENTER ONLY)
ALT: 17 U/L (ref 0–44)
AST: 14 U/L — ABNORMAL LOW (ref 15–41)
Albumin: 4.4 g/dL (ref 3.5–5.0)
Alkaline Phosphatase: 78 U/L (ref 38–126)
Anion gap: 7 (ref 5–15)
BUN: 18 mg/dL (ref 6–20)
CO2: 29 mmol/L (ref 22–32)
Calcium: 9.5 mg/dL (ref 8.9–10.3)
Chloride: 103 mmol/L (ref 98–111)
Creatinine: 1.4 mg/dL — ABNORMAL HIGH (ref 0.61–1.24)
GFR, Estimated: >60 mL/min (ref >60)
Glucose, Bld: 142 mg/dL — ABNORMAL HIGH (ref 70–99)
Potassium: 4.2 mmol/L (ref 3.5–5.1)
Sodium: 139 mmol/L (ref 135–145)
Total Bilirubin: 0.4 mg/dL (ref 0.3–1.2)
Total Protein: 7 g/dL (ref 6.5–8.1)

## 2023-01-13 LAB — CBC WITH DIFFERENTIAL (CANCER CENTER ONLY)
Abs Immature Granulocytes: 0.09 10*3/uL — ABNORMAL HIGH (ref 0.00–0.07)
Basophils Absolute: 0.1 10*3/uL (ref 0.0–0.1)
Basophils Relative: 0 %
Eosinophils Absolute: 0.2 10*3/uL (ref 0.0–0.5)
Eosinophils Relative: 1 %
HCT: 44.7 % (ref 39.0–52.0)
Hemoglobin: 15 g/dL (ref 13.0–17.0)
Immature Granulocytes: 1 %
Lymphocytes Relative: 27 %
Lymphs Abs: 4.5 10*3/uL — ABNORMAL HIGH (ref 0.7–4.0)
MCH: 29.8 pg (ref 26.0–34.0)
MCHC: 33.6 g/dL (ref 30.0–36.0)
MCV: 88.9 fL (ref 80.0–100.0)
Monocytes Absolute: 0.9 10*3/uL (ref 0.1–1.0)
Monocytes Relative: 6 %
Neutro Abs: 10.9 10*3/uL — ABNORMAL HIGH (ref 1.7–7.7)
Neutrophils Relative %: 65 %
Platelet Count: 339 10*3/uL (ref 150–400)
RBC: 5.03 MIL/uL (ref 4.22–5.81)
RDW: 12.7 % (ref 11.5–15.5)
WBC Count: 16.7 10*3/uL — ABNORMAL HIGH (ref 4.0–10.5)
nRBC: 0 % (ref 0.0–0.2)

## 2023-01-13 LAB — LACTATE DEHYDROGENASE: LDH: 123 U/L (ref 98–192)

## 2023-01-13 LAB — SAVE SMEAR(SSMR), FOR PROVIDER SLIDE REVIEW

## 2023-01-13 NOTE — Progress Notes (Addendum)
Hematology/Oncology Consultation   Name: Omar Shannon      MRN: [id number]    Location: Room/bed info not found  Date: 01/13/2023 Time:9:10 AM   REFERRING PHYSICIAN:  Jon Billings, MD  REASON FOR CONSULT:  Leukocytosis    DIAGNOSIS: Leukocytosis   HISTORY OF PRESENT ILLNESS: Omar Shannon is a 42 yo gentleman with mild leukocytosis over the last 4 years.  He denies any issues with frequent or recurrent infections.  He has history of pilonidal cyst and states that in his late teens he had surgery to try and remove. A portion is said to be imbedded in the spine and he chose to leave it there.  He smokes 1 ppd and 1-2 blunts daily.  ETOH use is daily. Patient prefers liquor.  He had his wisdom teeth removed without any complications.  No history of diabetes or thyroid disease.  No personal history of cancer. Father has renal cancer and paternal grandmother had colon.  He had 3 days in January (2024) that he had blood in his stool. He has been referred to GI Dr. Darlyn Chamber but states that insurance will not cover colonoscopy at this time.  No fever, chills, n/v, cough, rash, dizziness, SOB, chest pain, palpitations, abdominal pain or changes in bowel or bladder habits.  No swelling, tenderness, numbness or tingling in his extremities.  No falls or syncope reported.  Appetite and hydration are good. Weight is stable at 273 lbs.  He works as a IT sales professional.   ROS: All other 10 point review of systems is negative.   PAST MEDICAL HISTORY:   Past Medical History:  Diagnosis Date   No pertinent past medical history     ALLERGIES: No Known Allergies    MEDICATIONS:  Current Outpatient Medications on File Prior to Visit  Medication Sig Dispense Refill   methocarbamol (ROBAXIN) 500 MG tablet Take 1 tablet (500 mg total) by mouth every 8 (eight) hours as needed for muscle spasms. (Patient not taking: Reported on 09/27/2022) 50 tablet 0   naproxen (NAPROSYN) 500  MG tablet Take 1 tablet (500 mg total) by mouth 2 (two) times daily as needed. (Patient not taking: Reported on 09/27/2022) 30 tablet 0   sildenafil (REVATIO) 20 MG tablet May take 2-3 45 minutes prior (Patient not taking: Reported on 09/27/2022) 25 tablet 1   No current facility-administered medications on file prior to visit.     PAST SURGICAL HISTORY Past Surgical History:  Procedure Laterality Date   PILONIDAL CYST EXCISION      FAMILY HISTORY: Family History  Problem Relation Age of Onset   Hypertension Mother    Kidney cancer Father 43   Colon cancer Paternal Grandmother    Colon polyps Paternal Aunt    Hydrocephalus Daughter    Cerebral palsy Daughter     SOCIAL HISTORY:  reports that he has been smoking cigars and cigarettes. He has never used smokeless tobacco. He reports current alcohol use of about 15.0 standard drinks of alcohol per week. He reports that he does not currently use drugs after having used the following drugs: Marijuana.  PERFORMANCE STATUS: The patient's performance status is 0 - Asymptomatic  PHYSICAL EXAM: Most Recent Vital Signs: There were no vitals taken for this visit. BP 123/69 (BP Location: Left Arm, Patient Position: Sitting)   Pulse 83   Temp 97.8 F (36.6 C) (Oral)   Resp 18   Ht 5\' 7"  (1.702 m)   Wt 273 lb  12.8 oz (124.2 kg)   SpO2 97%   BMI 42.88 kg/m   General Appearance:    Alert, cooperative, no distress, appears stated age  Head:    Normocephalic, without obvious abnormality, atraumatic  Eyes:    PERRL, conjunctiva/corneas clear, EOM's intact, fundi    benign, both eyes             Throat:   Lips, mucosa, and tongue normal; teeth and gums normal  Neck:   Supple, symmetrical, trachea midline, no adenopathy;       thyroid:  No enlargement/tenderness/nodules; no carotid   bruit or JVD  Back:     Symmetric, no curvature, ROM normal, no CVA tenderness  Lungs:     Clear to auscultation bilaterally, respirations unlabored  Chest  wall:    No tenderness or deformity  Heart:    Regular rate and rhythm, S1 and S2 normal, no murmur, rub   or gallop  Abdomen:     Soft, non-tender, bowel sounds active all four quadrants,    no masses, no organomegaly        Extremities:   Extremities normal, atraumatic, no cyanosis or edema  Pulses:   2+ and symmetric all extremities  Skin:   Skin color, texture, turgor normal, no rashes or lesions  Lymph nodes:   Cervical, supraclavicular, and axillary nodes normal  Neurologic:   CNII-XII intact. Normal strength, sensation and reflexes      throughout    LABORATORY DATA:  No results found for this or any previous visit (from the past 48 hour(s)).    RADIOGRAPHY: No results found.     PATHOLOGY: None  ASSESSMENT/PLAN: Omar Shannon is a 42 yo gentleman with mild leukocytosis over the last 4 years possibly reactive. Assessment, CBC and blood smear reviewed with Dr. Marin Olp.  He was noted to have a few hyper segmented polys and enlarged platelets on smear.  We have added JAK 2 lab as well.  Follow-up pending results.   All questions were answered. The patient knows to call the clinic with any problems, questions or concerns. We can certainly see the patient much sooner if necessary.  The patient was discussed with Dr. Marin Olp and he is in agreement with the aforementioned.   Lottie Dawson, NP     Addendum: 01/24/2023 No follow-up needed. JAK 2 negative.

## 2023-01-24 LAB — JAK 2 V617F (GENPATH)

## 2023-01-25 ENCOUNTER — Telehealth: Payer: Self-pay | Admitting: *Deleted

## 2023-01-25 NOTE — Telephone Encounter (Signed)
Per 01/13/23 los No follow-up needed.

## 2023-03-07 ENCOUNTER — Ambulatory Visit: Payer: Managed Care, Other (non HMO) | Admitting: Family Medicine

## 2023-06-08 ENCOUNTER — Encounter (INDEPENDENT_AMBULATORY_CARE_PROVIDER_SITE_OTHER): Payer: Self-pay

## 2023-06-29 ENCOUNTER — Ambulatory Visit: Payer: Managed Care, Other (non HMO) | Admitting: Family Medicine

## 2023-06-29 ENCOUNTER — Encounter: Payer: Self-pay | Admitting: Family Medicine

## 2023-06-29 VITALS — BP 128/86 | HR 84 | Temp 98.4°F | Ht 67.0 in | Wt 273.4 lb

## 2023-06-29 DIAGNOSIS — Z789 Other specified health status: Secondary | ICD-10-CM

## 2023-06-29 DIAGNOSIS — Z131 Encounter for screening for diabetes mellitus: Secondary | ICD-10-CM | POA: Diagnosis not present

## 2023-06-29 DIAGNOSIS — M545 Low back pain, unspecified: Secondary | ICD-10-CM | POA: Diagnosis not present

## 2023-06-29 DIAGNOSIS — D72829 Elevated white blood cell count, unspecified: Secondary | ICD-10-CM

## 2023-06-29 MED ORDER — NAPROXEN 500 MG PO TABS: 500.0000 mg | ORAL_TABLET | Freq: Two times a day (BID) | ORAL | 0 refills | Status: AC | PRN

## 2023-06-29 MED ORDER — METHOCARBAMOL 500 MG PO TABS: 500.0000 mg | ORAL_TABLET | Freq: Three times a day (TID) | ORAL | 0 refills | Status: AC | PRN

## 2023-06-29 NOTE — Progress Notes (Signed)
Established Patient Office Visit   Subjective:  Patient ID: Omar Shannon, male    DOB: [birth year]  Age: 42 y.o. MRN: [id number]  Chief Complaint  Patient presents with   Medical Management of Chronic Issues    6 month follow up. Pt is fasting. Want WBC count checked.     HPI Encounter Diagnoses  Name Primary?   Leukocytosis, unspecified type Yes   Screening for diabetes mellitus    Low back pain without sciatica, unspecified back pain laterality, unspecified chronicity    Alcohol use    Ongoing history of intermittently but chronic lower back pain and stiffness.  At this time it is mostly nonradiating.  There has been no bowel or bladder or any continence.  He has appreciated fasciculations in his right lateral thigh area.  Naprosyn and Robaxin for as needed use have been helpful.  Requests a white blood cell count.  Workup for dyscrasia has been negative so far.  He has cut back significantly on alcohol intake.  Working 2 jobs and not exercising regularly but some.  Trying his best to be a great dad for his young daughter.   Review of Systems  Constitutional: Negative.   HENT: Negative.    Eyes:  Negative for blurred vision, discharge and redness.  Respiratory: Negative.    Cardiovascular: Negative.   Gastrointestinal:  Negative for abdominal pain.  Genitourinary: Negative.   Musculoskeletal:  Positive for back pain. Negative for myalgias.  Skin:  Negative for rash.  Neurological:  Negative for tingling, loss of consciousness and weakness.  Endo/Heme/Allergies:  Negative for polydipsia.     Current Outpatient Medications:    sildenafil (REVATIO) 20 MG tablet, May take 2-3 45 minutes prior, Disp: 25 tablet, Rfl: 1   methocarbamol (ROBAXIN) 500 MG tablet, Take 1 tablet (500 mg total) by mouth every 8 (eight) hours as needed for muscle spasms., Disp: 50 tablet, Rfl: 0   naproxen (NAPROSYN) 500 MG tablet, Take 1 tablet (500 mg total) by mouth 2 (two) times daily as needed.,  Disp: 30 tablet, Rfl: 0   Objective:     BP 128/86   Pulse 84   Temp 98.4 F (36.9 C)   Ht 5\' 7"  (1.702 m)   Wt 273 lb 6.4 oz (124 kg)   SpO2 97%   BMI 42.82 kg/m  BP Readings from Last 3 Encounters:  06/29/23 128/86  01/13/23 123/69  01/03/23 138/84   Wt Readings from Last 3 Encounters:  06/29/23 273 lb 6.4 oz (124 kg)  01/13/23 273 lb 12.8 oz (124.2 kg)  01/03/23 274 lb (124.3 kg)      Physical Exam Constitutional:      General: He is not in acute distress.    Appearance: Normal appearance. He is not ill-appearing, toxic-appearing or diaphoretic.  HENT:     Head: Normocephalic and atraumatic.     Right Ear: External ear normal.     Left Ear: External ear normal.  Eyes:     General: No scleral icterus.       Right eye: No discharge.        Left eye: No discharge.     Extraocular Movements: Extraocular movements intact.     Conjunctiva/sclera: Conjunctivae normal.  Pulmonary:     Effort: Pulmonary effort is normal. No respiratory distress.  Musculoskeletal:     Lumbar back: Normal range of motion.  Skin:    General: Skin is warm and dry.  Neurological:     Mental  Status: He is alert and oriented to person, place, and time.  Psychiatric:        Mood and Affect: Mood normal.        Behavior: Behavior normal.      No results found for any visits on 06/29/23.    The 10-year ASCVD risk score (Arnett DK, et al., 2019) is: 6.7%    Assessment & Plan:   Leukocytosis, unspecified type -     CBC with Differential/Platelet -     Comprehensive metabolic panel -     CBC with Differential/Platelet; Future  Screening for diabetes mellitus -     Hemoglobin A1c; Future -     Basic metabolic panel; Future  Low back pain without sciatica, unspecified back pain laterality, unspecified chronicity -     DG Lumbar Spine Complete; Future -     Ambulatory referral to Physical Therapy -     Naproxen; Take 1 tablet (500 mg total) by mouth 2 (two) times daily as needed.   Dispense: 30 tablet; Refill: 0 -     Methocarbamol; Take 1 tablet (500 mg total) by mouth every 8 (eight) hours as needed for muscle spasms.  Dispense: 50 tablet; Refill: 0  Alcohol use    Return Please return fasting for physical exam..   Mliss Sax, MD

## 2023-07-17 ENCOUNTER — Ambulatory Visit: Payer: Managed Care, Other (non HMO) | Admitting: Physical Therapy

## 2023-07-17 NOTE — Therapy (Deleted)
OUTPATIENT PHYSICAL THERAPY THORACOLUMBAR EVALUATION   Patient Name: Omar Shannon MRN: [id number] DOB:February 03, 1981, 42 y.o., male Today's Date: 07/17/2023  END OF SESSION:   Past Medical History:  Diagnosis Date   No pertinent past medical history    Past Surgical History:  Procedure Laterality Date   PILONIDAL CYST EXCISION     Patient Active Problem List   Diagnosis Date Noted   Low back pain without sciatica 06/29/2023   Leukocytosis 12/27/2022   Granulocytosis 09/30/2022   Hematochezia 09/27/2022   Elevated BP without diagnosis of hypertension 09/27/2022   Erectile dysfunction 05/13/2022   Situational depression 05/13/2022   Dermatitis 02/08/2022   Stress 02/08/2022   History of pilonidal cyst 02/08/2022   Elevated triglycerides with high cholesterol 11/10/2021   Alcohol use 11/10/2021   Hypertriglyceridemia 09/30/2021   Tobacco use 09/30/2021   Loud snoring 09/29/2021   Witnessed apneic spells 09/29/2021   Family history of colon cancer 09/29/2021   Screening for diabetes mellitus 08/27/2018   Morbid obesity (HCC) 08/27/2018    PCP: Mliss Sax, MD   REFERRING PROVIDER: Mliss Sax, MD   REFERRING DIAG: M54.50 (ICD-10-CM) - Low back pain without sciatica, unspecified back pain laterality, unspecified chronicity   Rationale for Evaluation and Treatment: Rehabilitation  THERAPY DIAG:  No diagnosis found.  ONSET DATE: 06/29/23  SUBJECTIVE:                                                                                                                                                                                           SUBJECTIVE STATEMENT: ***  PERTINENT HISTORY:  Per referring physician report: Ongoing history of intermittently but chronic lower back pain and stiffness. At this time it is mostly nonradiating. There has been no bowel or bladder or any continence. He has appreciated fasciculations in his right lateral thigh  area. Naprosyn and Robaxin for as needed use have been helpful.  Workup for dyscrasia has been negative so far. He has cut back significantly on alcohol intake. Working 2 jobs and not exercising regularly but some. Trying his best to be a great dad for his young daughter.   PAIN:  Are you having pain? {OPRCPAIN:27236}  PRECAUTIONS: Fall  RED FLAGS: None   WEIGHT BEARING RESTRICTIONS: No  FALLS:  Has patient fallen in last 6 months? {fallsyesno:27318}  LIVING ENVIRONMENT: Lives with: {OPRC lives with:25569::"lives with their family"} Lives in: {Lives in:25570} Stairs: {opstairs:27293} Has following equipment at home: {Assistive devices:23999}  OCCUPATION: ***  PLOF: {PLOF:24004}  PATIENT GOALS: ***  NEXT MD VISIT: ***  OBJECTIVE:   DIAGNOSTIC FINDINGS:  N/A  PATIENT SURVEYS:  {rehab  surveys:24030}  COGNITION: Overall cognitive status: {cognition:24006}     SENSATION: {sensation:27233}  MUSCLE LENGTH: Hamstrings: Right *** deg; Left *** deg Maisie Fus test: Right *** deg; Left *** deg  POSTURE: {posture:25561}  PALPATION: ***  LUMBAR ROM:   AROM eval  Flexion   Extension   Right lateral flexion   Left lateral flexion   Right rotation   Left rotation    (Blank rows = not tested)  LOWER EXTREMITY ROM:     {AROM/PROM:27142}  Right eval Left eval  Hip flexion    Hip extension    Hip abduction    Hip adduction    Hip internal rotation    Hip external rotation    Knee flexion    Knee extension    Ankle dorsiflexion    Ankle plantarflexion    Ankle inversion    Ankle eversion     (Blank rows = not tested)  LOWER EXTREMITY MMT:    MMT Right eval Left eval  Hip flexion    Hip extension    Hip abduction    Hip adduction    Hip internal rotation    Hip external rotation    Knee flexion    Knee extension    Ankle dorsiflexion    Ankle plantarflexion    Ankle inversion    Ankle eversion     (Blank rows = not tested)  LUMBAR SPECIAL  TESTS:  {lumbar special test:25242}  FUNCTIONAL TESTS:  {Functional tests:24029}  GAIT: Distance walked: *** Assistive device utilized: {Assistive devices:23999} Level of assistance: {Levels of assistance:24026} Comments: ***  TODAY'S TREATMENT:                                                                                                                              DATE:  07/17/23 Education    PATIENT EDUCATION:  Education details: POC Person educated: Patient Education method: Explanation Education comprehension: verbalized understanding  HOME EXERCISE PROGRAM: ***  ASSESSMENT:  CLINICAL IMPRESSION: Patient is a 42 y.o. who was seen today for physical therapy evaluation and treatment for chronic, intermittent LBP without sciatica***.   OBJECTIVE IMPAIRMENTS: decreased activity tolerance, decreased coordination, decreased endurance, difficulty walking, decreased ROM, decreased strength, impaired flexibility, improper body mechanics, postural dysfunction, and pain.   ACTIVITY LIMITATIONS: lifting, bending, and locomotion level  PARTICIPATION LIMITATIONS: cleaning, laundry, driving, shopping, community activity, occupation, and yard work  PERSONAL FACTORS: Past/current experiences are also affecting patient's functional outcome.   REHAB POTENTIAL: Good  CLINICAL DECISION MAKING: Stable/uncomplicated  EVALUATION COMPLEXITY: Low   GOALS: Goals reviewed with patient? Yes  SHORT TERM GOALS: Target date: 07/31/23  I with basic HEP Baseline: Goal status: INITIAL  LONG TERM GOALS: Target date: ***  I with final HEP Baseline:  Goal status: INITIAL  2.  Improve FOTO score to at least Baseline:  Goal status: INITIAL  3.  Patient will report improved back pain by at least 50% while performing normal daily activities.  Baseline:  Goal status: INITIAL  4.  *** Baseline:  Goal status: INITIAL  5.  *** Baseline:  Goal status: INITIAL  6.  *** Baseline:   Goal status: INITIAL  PLAN:  PT FREQUENCY: 1-2x/week  PT DURATION: 10 weeks  PLANNED INTERVENTIONS: Therapeutic exercises, Therapeutic activity, Neuromuscular re-education, Balance training, Gait training, Patient/Family education, Self Care, Joint mobilization, Dry Needling, Electrical stimulation, Spinal mobilization, Cryotherapy, Moist heat, Traction, Ultrasound, Ionotophoresis 4mg /ml Dexamethasone, and Manual therapy.  PLAN FOR NEXT SESSION: ***   Iona Beard, DPT 07/17/2023, 9:15 AM

## 2024-02-21 ENCOUNTER — Ambulatory Visit: Admitting: Nurse Practitioner

## 2024-02-21 ENCOUNTER — Ambulatory Visit: Payer: Self-pay

## 2024-02-21 ENCOUNTER — Encounter: Payer: Self-pay | Admitting: Nurse Practitioner

## 2024-02-21 VITALS — BP 132/84 | HR 97 | Temp 96.8°F | Ht 68.0 in | Wt 264.0 lb

## 2024-02-21 DIAGNOSIS — F32A Depression, unspecified: Secondary | ICD-10-CM

## 2024-02-21 DIAGNOSIS — R079 Chest pain, unspecified: Secondary | ICD-10-CM | POA: Diagnosis not present

## 2024-02-21 DIAGNOSIS — F419 Anxiety disorder, unspecified: Secondary | ICD-10-CM | POA: Insufficient documentation

## 2024-02-21 DIAGNOSIS — N529 Male erectile dysfunction, unspecified: Secondary | ICD-10-CM

## 2024-02-21 DIAGNOSIS — R7301 Impaired fasting glucose: Secondary | ICD-10-CM | POA: Diagnosis not present

## 2024-02-21 DIAGNOSIS — Z114 Encounter for screening for human immunodeficiency virus [HIV]: Secondary | ICD-10-CM

## 2024-02-21 DIAGNOSIS — Z1159 Encounter for screening for other viral diseases: Secondary | ICD-10-CM

## 2024-02-21 NOTE — Assessment & Plan Note (Signed)
 Chronic, ongoing. Will have him come back between 8am-10am to check testosterone level.

## 2024-02-21 NOTE — Progress Notes (Signed)
 Acute Office Visit  Subjective:     Patient ID: Omar Shannon, male    DOB: 1981/05/17, 43 y.o.   MRN: [id number]  Chief Complaint  Patient presents with   Chest Pain    State motivation is down.     HPI Discussed the use of AI scribe software for clinical note transcription with the patient, who gave verbal consent to proceed.  History of Present Illness   Omar Shannon is a 43 year old male who presents with chest pain and concerns about low testosterone.  He experiences intermittent left sided chest pain since early this year, described as a 'tight muscle' feeling on the left side of the chest without radiation. There is no associated shortness of breath or back pain. He also experiences a vibrating sensation in his legs, similar to a phone vibrating, and has noticed varicose veins.  He is concerned about weight gain and decreased sex drive, including difficulties with sexual performance and ejaculation. He has not been sexually active with his partner due to relationship issues but has experienced similar difficulties with other partners. He lacks motivation even when attempting to masturbate.  He works two jobs with a disrupted sleep schedule, often working overnight shifts.        02/21/2024    4:50 PM 12/27/2022    1:06 PM 09/27/2022    8:51 AM 09/06/2022    3:05 PM 05/13/2022    4:58 PM  Depression screen PHQ 2/9  Decreased Interest 3 0 0 0 1  Down, Depressed, Hopeless 2 0 0 0 0  PHQ - 2 Score 5 0 0 0 1  Altered sleeping 3    2  Tired, decreased energy 3    1  Change in appetite 1    3  Feeling bad or failure about yourself  3    1  Trouble concentrating 0    1  Moving slowly or fidgety/restless 0    1  Suicidal thoughts 0    0  PHQ-9 Score 15    10  Difficult doing work/chores     Not difficult at all      02/21/2024    4:50 PM 05/13/2022    4:59 PM 09/29/2021    1:59 PM  GAD 7 : Generalized Anxiety Score  Nervous, Anxious, on Edge 3 3 2   Control/stop  worrying 2 1 1   Worry too much - different things 2 2 0  Trouble relaxing 3 3 1   Restless 0 2 1  Easily annoyed or irritable 3 2 3   Afraid - awful might happen 0 1 2  Total GAD 7 Score 13 14 10   Anxiety Difficulty  Very difficult Very difficult    ROS See pertinent positives and negatives per HPI.     Objective:    BP 132/84   Pulse 97   Temp (!) 96.8 F (36 C) (Temporal)   Ht 5\' 8"  (1.727 m)   Wt 264 lb (119.7 kg)   SpO2 95%   BMI 40.14 kg/m     Physical Exam Vitals and nursing note reviewed.  Constitutional:      Appearance: Normal appearance.  HENT:     Head: Normocephalic.  Eyes:     Conjunctiva/sclera: Conjunctivae normal.  Cardiovascular:     Rate and Rhythm: Normal rate and regular rhythm.     Pulses: Normal pulses.     Heart sounds: Normal heart sounds.  Pulmonary:     Effort: Pulmonary effort  is normal.     Breath sounds: Normal breath sounds.  Musculoskeletal:     Cervical back: Normal range of motion and neck supple. No tenderness.     Right lower leg: No edema.     Left lower leg: No edema.  Lymphadenopathy:     Cervical: No cervical adenopathy.  Skin:    General: Skin is warm.  Neurological:     General: No focal deficit present.     Mental Status: He is alert and oriented to person, place, and time.  Psychiatric:        Mood and Affect: Mood normal.        Behavior: Behavior normal.        Thought Content: Thought content normal.        Judgment: Judgment normal.       Assessment & Plan:   Problem List Items Addressed This Visit       Other   Erectile dysfunction   Chronic, ongoing. Will have him come back between 8am-10am to check testosterone level.       Relevant Orders   Testosterone,Free and Total   Chest pain - Primary   Intermittent left-sided chest pain for the last 4 month. EKG shows normal sinus rhythm with a heart rate of 95, no ST or T wave changes. Will check CMP, CBC, TSH, B12, and lipid panel. He has been having  low motivation, depressed mood as well. Symptoms could be related to anxiety. Follow-up with PCP in 4 weeks.       Relevant Orders   EKG 12-Lead (Completed)   CBC with Differential/Platelet   Lipid panel   Comprehensive metabolic panel with GFR   TSH   Vitamin B12   Anxiety and depression   He has been experiencing some increase in anxiety between working 2 jobs and going through a divorce. This could be playing a role in his chest pain. Will check TSH today. Follow-up based on results.       Relevant Orders   TSH   Other Visit Diagnoses       Screening for HIV (human immunodeficiency virus)       Screen HIV today   Relevant Orders   HIV Antibody (routine testing w rflx)     Encounter for hepatitis C screening test for low risk patient       Screen hepatitis C today   Relevant Orders   Hepatitis C antibody     IFG (impaired fasting glucose)       Check A1c today   Relevant Orders   Hemoglobin A1c       No orders of the defined types were placed in this encounter.   Return in about 4 weeks (around 03/20/2024) for with PCP follow-up chest pain, anxiety.  Odette Benjamin, NP

## 2024-02-21 NOTE — Progress Notes (Signed)
 EKG interpreted by me on 02/21/24 showed normal sinus rhythm with a heart rate of 95. No ST or T wave changes.

## 2024-02-21 NOTE — Assessment & Plan Note (Signed)
 He has been experiencing some increase in anxiety between working 2 jobs and going through a divorce. This could be playing a role in his chest pain. Will check TSH today. Follow-up based on results.

## 2024-02-21 NOTE — Telephone Encounter (Signed)
 I called and spoke with patient and would like to come in today to be seen.

## 2024-02-21 NOTE — Assessment & Plan Note (Signed)
 Intermittent left-sided chest pain for the last 4 month. EKG shows normal sinus rhythm with a heart rate of 95, no ST or T wave changes. Will check CMP, CBC, TSH, B12, and lipid panel. He has been having low motivation, depressed mood as well. Symptoms could be related to anxiety. Follow-up with PCP in 4 weeks.

## 2024-02-21 NOTE — Telephone Encounter (Signed)
 Chief Complaint: CP/Tightness Symptoms: muscle tightness Frequency: Ongoing since "start of the year" Pertinent Negatives: Patient denies SOB Disposition: [] ED /[] Urgent Care (no appt availability in office) / [x] Appointment(In office/virtual)/ []  [facility]/ [] Home Care/ [] Refused Recommended Disposition /[] [facility]/ []  Follow-up with PCP Additional Notes: Pt reports he has been experiencing increasing chest tightness, "deep muscle tightness" in left side chest. Pt notes this has been occurring intermittently since "the start of the year". Denies SOB, HA, vision changes, sweating, N/V, radiating pain. OV scheduled. This RN educated pt on home care, new-worsening symptoms, when to call back/seek emergent care. Pt verbalized understanding and agrees to plan.   Reason for Disposition  [1] Chest pain lasts > 5 minutes AND [2] occurred > 3 days ago (72 hours) AND [3] NO chest pain or cardiac symptoms now  Answer Assessment - Initial Assessment Questions 1. LOCATION: "Where does it hurt?"       Left side chest 2. RADIATION: "Does the pain go anywhere else?" (e.g., into neck, jaw, arms, back)     None 3. ONSET: "When did the chest pain begin?" (Minutes, hours or days)       "Earlier this year" 4. PATTERN: "Does the pain come and go, or has it been constant since it started?"  "Does it get worse with exertion?"      Intermittent, at least 5 times a week  6. SEVERITY: "How bad is the pain?"  (e.g., Scale 1-10; mild, moderate, or severe)    - MILD (1-3): doesn't interfere with normal activities     - MODERATE (4-7): interferes with normal activities or awakens from sleep    - SEVERE (8-10): excruciating pain, unable to do any normal activities       3/10 7. CARDIAC RISK FACTORS: "Do you have any history of heart problems or risk factors for heart disease?" (e.g., angina, prior heart attack; diabetes, high blood pressure, high cholesterol, smoker, or strong family history  of heart disease)     Smoker  9. CAUSE: "What do you think is causing the chest pain?"     Pt believes it is related to physical activity, stress 10. OTHER SYMPTOMS: "Do you have any other symptoms?" (e.g., dizziness, nausea, vomiting, sweating, fever, difficulty breathing, cough)       None  Protocols used: Chest Pain-A-AH

## 2024-02-21 NOTE — Patient Instructions (Signed)
It was great to see you!  We are checking your labs today and will let you know the results via mychart/phone.    Let's follow-up in 4 weeks, sooner if you have concerns.  If a referral was placed today, you will be contacted for an appointment. Please note that routine referrals can sometimes take up to 3-4 weeks to process. Please call our office if you haven't heard anything after this time frame.  Take care,  Delores Edelstein, NP  

## 2024-02-22 ENCOUNTER — Encounter: Payer: Self-pay | Admitting: Nurse Practitioner

## 2024-02-22 ENCOUNTER — Other Ambulatory Visit

## 2024-02-22 DIAGNOSIS — N529 Male erectile dysfunction, unspecified: Secondary | ICD-10-CM

## 2024-02-22 LAB — CBC WITH DIFFERENTIAL/PLATELET
Basophils Absolute: 0.1 10*3/uL (ref 0.0–0.1)
Basophils Relative: 0.7 % (ref 0.0–3.0)
Eosinophils Absolute: 0.2 10*3/uL (ref 0.0–0.7)
Eosinophils Relative: 1.4 % (ref 0.0–5.0)
HCT: 46.3 % (ref 39.0–52.0)
Hemoglobin: 15.1 g/dL (ref 13.0–17.0)
Lymphocytes Relative: 26.4 % (ref 12.0–46.0)
Lymphs Abs: 4.4 10*3/uL — ABNORMAL HIGH (ref 0.7–4.0)
MCHC: 32.6 g/dL (ref 30.0–36.0)
MCV: 91.1 fl (ref 78.0–100.0)
Monocytes Absolute: 1.3 10*3/uL — ABNORMAL HIGH (ref 0.1–1.0)
Monocytes Relative: 7.7 % (ref 3.0–12.0)
Neutro Abs: 10.6 10*3/uL — ABNORMAL HIGH (ref 1.4–7.7)
Neutrophils Relative %: 63.8 % (ref 43.0–77.0)
Platelets: 328 10*3/uL (ref 150.0–400.0)
RBC: 5.08 Mil/uL (ref 4.22–5.81)
RDW: 13.3 % (ref 11.5–15.5)
WBC: 16.7 10*3/uL — ABNORMAL HIGH (ref 4.0–10.5)

## 2024-02-22 LAB — LIPID PANEL
Cholesterol: 236 mg/dL — ABNORMAL HIGH (ref 0–200)
HDL: 36.9 mg/dL — ABNORMAL LOW (ref >39.00)
LDL Cholesterol: 158 mg/dL — ABNORMAL HIGH (ref 0–99)
NonHDL: 198.76
Total CHOL/HDL Ratio: 6
Triglycerides: 206 mg/dL — ABNORMAL HIGH (ref 0.0–149.0)
VLDL: 41.2 mg/dL — ABNORMAL HIGH (ref 0.0–40.0)

## 2024-02-22 LAB — HEMOGLOBIN A1C: Hgb A1c MFr Bld: 6 % (ref 4.6–6.5)

## 2024-02-22 LAB — COMPREHENSIVE METABOLIC PANEL WITH GFR
ALT: 17 U/L (ref 0–53)
AST: 18 U/L (ref 0–37)
Albumin: 4.4 g/dL (ref 3.5–5.2)
Alkaline Phosphatase: 87 U/L (ref 39–117)
BUN: 23 mg/dL (ref 6–23)
CO2: 22 meq/L (ref 19–32)
Calcium: 9.4 mg/dL (ref 8.4–10.5)
Chloride: 106 meq/L (ref 96–112)
Creatinine, Ser: 1.56 mg/dL — ABNORMAL HIGH (ref 0.40–1.50)
GFR: 54.24 mL/min — ABNORMAL LOW (ref >60.00)
Glucose, Bld: 93 mg/dL (ref 70–99)
Potassium: 4.6 meq/L (ref 3.5–5.1)
Sodium: 137 meq/L (ref 135–145)
Total Bilirubin: 0.6 mg/dL (ref 0.2–1.2)
Total Protein: 7.2 g/dL (ref 6.0–8.3)

## 2024-02-22 LAB — HIV ANTIBODY (ROUTINE TESTING W REFLEX): HIV 1&2 Ab, 4th Generation: NONREACTIVE

## 2024-02-22 LAB — TSH: TSH: 2.37 u[IU]/mL (ref 0.35–5.50)

## 2024-02-22 LAB — VITAMIN B12: Vitamin B-12: 376 pg/mL (ref 211–911)

## 2024-02-22 LAB — HEPATITIS C ANTIBODY: Hepatitis C Ab: NONREACTIVE

## 2024-02-23 ENCOUNTER — Ambulatory Visit: Admitting: Nurse Practitioner

## 2024-02-24 LAB — TESTOSTERONE,FREE AND TOTAL
Testosterone, Free: 11.3 pg/mL (ref 6.8–21.5)
Testosterone: 521 ng/dL (ref 264–916)

## 2024-02-26 ENCOUNTER — Encounter: Payer: Self-pay | Admitting: Nurse Practitioner

## 2024-03-19 ENCOUNTER — Ambulatory Visit: Admitting: Family Medicine
